# Patient Record
Sex: Male | Born: 1965 | Race: White | Hispanic: No | Smoking: Never smoker
Health system: Southern US, Community
[De-identification: ages and names within clinical notes are randomized; demographics above are authoritative.]

## PROBLEM LIST (undated history)

## (undated) DIAGNOSIS — Z9889 Other specified postprocedural states: Secondary | ICD-10-CM

## (undated) DIAGNOSIS — K219 Gastro-esophageal reflux disease without esophagitis: Secondary | ICD-10-CM

## (undated) DIAGNOSIS — J45909 Unspecified asthma, uncomplicated: Secondary | ICD-10-CM

## (undated) DIAGNOSIS — R112 Nausea with vomiting, unspecified: Secondary | ICD-10-CM

## (undated) HISTORY — PX: OTHER SURGICAL HISTORY: SHX169

## (undated) HISTORY — PX: SINUS SURGERY WITH INSTATRAK: SHX5215

---

## 2000-12-21 ENCOUNTER — Emergency Department (HOSPITAL_COMMUNITY): Admission: EM | Admit: 2000-12-21 | Discharge: 2000-12-21 | Payer: Self-pay | Admitting: Emergency Medicine

## 2002-03-07 ENCOUNTER — Encounter: Payer: Self-pay | Admitting: Internal Medicine

## 2002-03-07 ENCOUNTER — Ambulatory Visit (HOSPITAL_COMMUNITY): Admission: RE | Admit: 2002-03-07 | Discharge: 2002-03-07 | Payer: Self-pay | Admitting: Internal Medicine

## 2002-03-16 ENCOUNTER — Emergency Department (HOSPITAL_COMMUNITY): Admission: EM | Admit: 2002-03-16 | Discharge: 2002-03-17 | Payer: Self-pay | Admitting: Emergency Medicine

## 2002-03-20 ENCOUNTER — Ambulatory Visit (HOSPITAL_COMMUNITY): Admission: RE | Admit: 2002-03-20 | Discharge: 2002-03-20 | Payer: Self-pay | Admitting: Internal Medicine

## 2002-03-20 ENCOUNTER — Encounter: Payer: Self-pay | Admitting: Internal Medicine

## 2002-04-01 ENCOUNTER — Ambulatory Visit (HOSPITAL_COMMUNITY): Admission: RE | Admit: 2002-04-01 | Discharge: 2002-04-01 | Payer: Self-pay | Admitting: Pulmonary Disease

## 2002-04-08 ENCOUNTER — Ambulatory Visit (HOSPITAL_COMMUNITY): Admission: RE | Admit: 2002-04-08 | Discharge: 2002-04-08 | Payer: Self-pay | Admitting: Pulmonary Disease

## 2002-12-26 ENCOUNTER — Ambulatory Visit (HOSPITAL_COMMUNITY): Admission: RE | Admit: 2002-12-26 | Discharge: 2002-12-26 | Payer: Self-pay | Admitting: *Deleted

## 2003-12-31 ENCOUNTER — Ambulatory Visit (HOSPITAL_COMMUNITY): Admission: RE | Admit: 2003-12-31 | Discharge: 2003-12-31 | Payer: Self-pay | Admitting: Family Medicine

## 2005-12-02 ENCOUNTER — Ambulatory Visit (HOSPITAL_COMMUNITY): Admission: RE | Admit: 2005-12-02 | Discharge: 2005-12-02 | Payer: Self-pay | Admitting: Family Medicine

## 2007-10-26 ENCOUNTER — Emergency Department (HOSPITAL_COMMUNITY): Admission: EM | Admit: 2007-10-26 | Discharge: 2007-10-26 | Payer: Self-pay | Admitting: Emergency Medicine

## 2010-06-25 NOTE — Procedures (Signed)
Seth Hartman, Seth Hartman                         ACCOUNT NO.:  [id number]   MEDICAL RECORD NO.:  [id number]                   PATIENT TYPE:  OUT   LOCATION:  RAD                                  FACILITY:  [facility]   PHYSICIAN:  [place] Bing, M.D.               DATE OF BIRTH:  Jul 21, 1965   DATE OF PROCEDURE:  DATE OF DISCHARGE:                                  ECHOCARDIOGRAM   REFERRING:  Dr. Alessandra Bevels.  Dr. Dorethea Clan.   CLINICAL DATA:  A 45 year old gentleman with abnormal EKG and PVC's.   M-MODE:  1. Aorta 3.0.  2. Left atrium 3.3.  3. Septum 1.1.  4. Posterior wall 0.8.  5. LV diastole 5.4.  6. LV systole 3.6.   FINDINGS:  1. Technically-adequate echocardiographic study.  2. Normal left atrium, right atrium and right ventricle.  3. Normal mitral, aortic, tricuspid and pulmonic valves.  4. Very mild dilatation of the left ventricle; no left ventricular     hypertrophy.  5. Normal regional and global left-ventricular systolic function.      ___________________________________________                                            [place] Bing, M.D.   RR/MEDQ  D:  12/26/2002  T:  12/27/2002  Job:  [id number]

## 2010-06-25 NOTE — Procedures (Signed)
   NAMEGERAL, COKER                         ACCOUNT NO.:  [id number]   MEDICAL RECORD NO.:  [id number]                   PATIENT TYPE:  OUT   LOCATION:  RESP                                 FACILITY:  [facility]   PHYSICIAN:  Edward L. Juanetta Gosling, M.D.             DATE OF BIRTH:  [birth year]   DATE OF PROCEDURE:  04/09/2002  DATE OF DISCHARGE:                              PULMONARY FUNCTION TEST   PFT FINDINGS:  Spirometry is normal.                                               Edward L. Juanetta Gosling, M.D.    ELH/MEDQ  D:  04/09/2002  T:  04/09/2002  Job:  [id number]

## 2011-10-17 ENCOUNTER — Other Ambulatory Visit: Payer: Self-pay | Admitting: Occupational Medicine

## 2011-10-17 ENCOUNTER — Ambulatory Visit (HOSPITAL_BASED_OUTPATIENT_CLINIC_OR_DEPARTMENT_OTHER)
Admission: RE | Admit: 2011-10-17 | Discharge: 2011-10-17 | Disposition: A | Discharge status: Home / Self Care | Payer: Managed Care, Other (non HMO) | Source: Ambulatory Visit | Attending: Occupational Medicine | Admitting: Occupational Medicine

## 2011-10-17 DIAGNOSIS — Z0000 Encounter for general adult medical examination without abnormal findings: Secondary | ICD-10-CM

## 2011-11-16 ENCOUNTER — Other Ambulatory Visit (HOSPITAL_COMMUNITY): Payer: Self-pay | Admitting: Family Medicine

## 2011-11-16 ENCOUNTER — Ambulatory Visit (HOSPITAL_COMMUNITY)
Admission: RE | Admit: 2011-11-16 | Discharge: 2011-11-16 | Disposition: A | Discharge status: Home / Self Care | Payer: Managed Care, Other (non HMO) | Source: Ambulatory Visit | Attending: Family Medicine | Admitting: Family Medicine

## 2011-11-16 DIAGNOSIS — R059 Cough, unspecified: Secondary | ICD-10-CM | POA: Insufficient documentation

## 2011-11-16 DIAGNOSIS — R05 Cough: Secondary | ICD-10-CM | POA: Insufficient documentation

## 2011-11-18 ENCOUNTER — Emergency Department (HOSPITAL_COMMUNITY)
Admission: EM | Admit: 2011-11-18 | Discharge: 2011-11-19 | Disposition: A | Discharge status: Home / Self Care | Payer: Managed Care, Other (non HMO) | Attending: Emergency Medicine | Admitting: Emergency Medicine

## 2011-11-18 ENCOUNTER — Encounter (HOSPITAL_COMMUNITY): Payer: Self-pay | Admitting: *Deleted

## 2011-11-18 ENCOUNTER — Emergency Department (HOSPITAL_COMMUNITY): Payer: Managed Care, Other (non HMO)

## 2011-11-18 DIAGNOSIS — R0602 Shortness of breath: Secondary | ICD-10-CM | POA: Insufficient documentation

## 2011-11-18 DIAGNOSIS — R059 Cough, unspecified: Secondary | ICD-10-CM | POA: Insufficient documentation

## 2011-11-18 DIAGNOSIS — R05 Cough: Secondary | ICD-10-CM | POA: Insufficient documentation

## 2011-11-18 DIAGNOSIS — J45909 Unspecified asthma, uncomplicated: Secondary | ICD-10-CM

## 2011-11-18 DIAGNOSIS — R079 Chest pain, unspecified: Secondary | ICD-10-CM | POA: Insufficient documentation

## 2011-11-18 HISTORY — DX: Unspecified asthma, uncomplicated: J45.909

## 2011-11-18 LAB — CBC
HCT: 42.6 % (ref 39.0–52.0)
Hemoglobin: 14.4 g/dL (ref 13.0–17.0)
MCH: 30.3 pg (ref 26.0–34.0)
MCHC: 33.8 g/dL (ref 30.0–36.0)
MCV: 89.5 fL (ref 78.0–100.0)
Platelets: 346 10*3/uL (ref 150–400)
RBC: 4.76 MIL/uL (ref 4.22–5.81)
RDW: 12.8 % (ref 11.5–15.5)
WBC: 11.7 10*3/uL — ABNORMAL HIGH (ref 4.0–10.5)

## 2011-11-18 LAB — COMPREHENSIVE METABOLIC PANEL
ALT: 59 U/L — ABNORMAL HIGH (ref 0–53)
AST: 24 U/L (ref 0–37)
Albumin: 4 g/dL (ref 3.5–5.2)
Alkaline Phosphatase: 61 U/L (ref 39–117)
BUN: 15 mg/dL (ref 6–23)
CO2: 23 mEq/L (ref 19–32)
Calcium: 9.3 mg/dL (ref 8.4–10.5)
Chloride: 103 mEq/L (ref 96–112)
Creatinine, Ser: 0.84 mg/dL (ref 0.50–1.35)
GFR calc Af Amer: >90 mL/min (ref >90)
GFR calc non Af Amer: >90 mL/min (ref >90)
Glucose, Bld: 124 mg/dL — ABNORMAL HIGH (ref 70–99)
Potassium: 3.8 mEq/L (ref 3.5–5.1)
Sodium: 137 mEq/L (ref 135–145)
Total Bilirubin: 0.7 mg/dL (ref 0.3–1.2)
Total Protein: 7.1 g/dL (ref 6.0–8.3)

## 2011-11-18 LAB — D-DIMER, QUANTITATIVE: D-Dimer, Quant: <0.27 ug/mL-FEU (ref 0.00–0.48)

## 2011-11-18 LAB — TROPONIN I: Troponin I: <0.3 ng/mL (ref <0.30)

## 2011-11-18 MED ORDER — ALBUTEROL (5 MG/ML) CONTINUOUS INHALATION SOLN
10.0000 mg/h | INHALATION_SOLUTION | RESPIRATORY_TRACT | Status: DC
  Administered 2011-11-18: 10 mg/h via RESPIRATORY_TRACT
  Filled 2011-11-18: qty 20

## 2011-11-18 MED ORDER — IPRATROPIUM BROMIDE 0.02 % IN SOLN
0.5000 mg | Freq: Once | RESPIRATORY_TRACT | Status: AC
  Administered 2011-11-18: 0.5 mg via RESPIRATORY_TRACT
  Filled 2011-11-18: qty 2.5

## 2011-11-18 NOTE — ED Provider Notes (Cosign Needed)
History  This chart was scribed for Ward Givens, MD by Erskine Emery. This patient was seen in room APA07/APA07 and the patient's care was started at 20:11.   CSN: [id number]  Arrival date & time 11/18/11  1949   First MD Initiated Contact with Patient 11/18/11 2011      Chief Complaint  Patient presents with  . Chest Pain    (Consider location/radiation/quality/duration/timing/severity/associated sxs/prior treatment) The history is provided by the patient. No language interpreter was used.  Seth Hartman is a 46 y.o. male who presents to the Emergency Department complaining of gradually worsening intermittent SOB and chest tightness for the past 2 weeks. Pt denies any associated wheezing, fevers, leg swelling, or recent long travel but reports a mild cough. Pt reports his symptoms are usually aggravated by movement and relieved by sitting down but this afternoon the chest tightness/pressure and SOB came on while sitting down at rest. Pt reports 2 weeks ago he woke up in the middle of the night with body aches, chills, and weakness. The following day he saw a PA at [facility] who gave him a z pak and a shot. Pt is also currently on prednisone; he was taking 2 x4/day but now is taking 1 x2/day with no relief from symptoms even when he uses his inhaler. Pt has a h/o asthma and claims this episode is not like previous episodes because he has no wheezing present at this time. Pt reports he checks his oxygen with a pulse ox at work often and it is always around 98 and 99 so the fact that it is now 96 while resting is concerning. Pt had an all negative stress test about 1 year ago and had a physical at [facility] 1 month ago with all negative findings.  Dr. Sherwood Gambler is the pt's PCP.  Past Medical History  Diagnosis Date  . Asthma     Past Surgical History  Procedure Date  . Sinus surgery with instatrak   . Pvc's    Pt's father died in an MVC at 65 but his maternal grandfather died  of a heart attack in his late 70s. Pt knows of no h/o blood clots in his family.  History  Substance Use Topics  . Smoking status: Never Smoker   . Smokeless tobacco: Not on file  . Alcohol Use: No   Pt never smoked.  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 Lives with spouse  Review of Systems  Constitutional: Negative for fever and chills.  Respiratory: Positive for cough, chest tightness and shortness of breath. Negative for wheezing.   Cardiovascular: Positive for chest pain. Negative for leg swelling.  Gastrointestinal: Negative for nausea and vomiting.  Neurological: Negative for weakness.  All other systems reviewed and are negative.    Allergies  Penicillins; Bee venom; and Codeine  Home Medications   Current Outpatient Rx  Name Route Sig Dispense Refill  . ALBUTEROL SULFATE HFA 108 (90 BASE) MCG/ACT IN AERS Inhalation Inhale 2 puffs into the lungs every 6 (six) hours as needed. For shortness of breath    . PREDNISONE (PAK) PO Oral Take by mouth daily. Take as directed per package instructions (6,5,4,3,2,1)      Triage Vitals: BP 155/90  Pulse 78  Temp 98.3 F (36.8 C) (Oral)  Resp 13  Ht 5\' 6"  (1.676 m)  Wt 179 lb (81.194 kg)  BMI 28.89 kg/m2  SpO2 96%  Vital signs normal except hypertension  Physical Exam  Nursing note and vitals reviewed. Constitutional: He is oriented to person, place, and time. He appears well-developed and well-nourished.  Non-toxic appearance. He does not appear ill. No distress.  HENT:  Head: Normocephalic and atraumatic.  Right Ear: External ear normal.  Left Ear: External ear normal.  Nose: Nose normal. No mucosal edema or rhinorrhea.  Mouth/Throat: Oropharynx is clear and moist and mucous membranes are normal. No dental abscesses or uvula swelling.  Eyes: Conjunctivae normal and EOM are normal. Pupils are equal, round, and reactive to light.  Neck: Normal range of motion and full passive range of  motion without pain. Neck supple.  Cardiovascular: Normal rate, regular rhythm and normal heart sounds.  Exam reveals no gallop and no friction rub.   No murmur heard. Pulmonary/Chest: Effort normal and breath sounds normal. No respiratory distress. He has no wheezes. He has no rhonchi. He has no rales. He exhibits no tenderness and no crepitus.       Pt noted to have some coughing, especially with deep breathing.  Abdominal: Soft. Normal appearance and bowel sounds are normal. He exhibits no distension. There is no tenderness. There is no rebound and no guarding.  Musculoskeletal: Normal range of motion. He exhibits no edema and no tenderness.       Moves all extremities well.   Neurological: He is alert and oriented to person, place, and time. He has normal strength. No cranial nerve deficit.  Skin: Skin is warm, dry and intact. No rash noted. No erythema. No pallor.  Psychiatric: He has a normal mood and affect. His speech is normal and behavior is normal. His mood appears not anxious.    ED Course  Procedures (including critical care time)   Medications  PREDNISONE, PAK, PO (not administered)  albuterol (PROVENTIL HFA;VENTOLIN HFA) 108 (90 BASE) MCG/ACT inhaler (not administered)  albuterol (PROVENTIL,VENTOLIN) solution continuous neb (10 mg/hr Nebulization New Bag/Given 11/18/11 2250)  albuterol-ipratropium (COMBIVENT) 18-103 MCG/ACT inhaler (not administered)  predniSONE (DELTASONE) 10 MG tablet (not administered)  doxycycline (VIBRAMYCIN) 100 MG capsule (not administered)  ipratropium (ATROVENT) nebulizer solution 0.5 mg (0.5 mg Nebulization Given 11/18/11 2250)    DIAGNOSTIC STUDIES: Oxygen Saturation is 96% on room air, adequate by my interpretation.    COORDINATION OF CARE: 20:35--I evaluated the patient and we discussed a treatment plan including chest x-ray and blood work to which the pt agreed.   22:32--I rechecked the pt and explained his all negative imaging and lab  results. Pt consented to try a breathing treatment.  Pt is not allergic to peanuts.  23:55 recheck at end on continuous nebulizer, has improved air movement. States his chest tightness is gone.   Results for orders placed during the hospital encounter of 11/18/11  CBC      Component Value Range   WBC 11.7 (*) 4.0 - 10.5 K/uL   RBC 4.76  4.22 - 5.81 MIL/uL   Hemoglobin 14.4  13.0 - 17.0 g/dL   HCT 16.1  09.6 - 04.5 %   MCV 89.5  78.0 - 100.0 fL   MCH 30.3  26.0 - 34.0 pg   MCHC 33.8  30.0 - 36.0 g/dL   RDW 40.9  81.1 - 91.4 %   Platelets 346  150 - 400 K/uL  COMPREHENSIVE METABOLIC PANEL      Component Value Range   Sodium 137  135 - 145 mEq/L   Potassium 3.8  3.5 - 5.1 mEq/L   Chloride 103  96 - 112  mEq/L   CO2 23  19 - 32 mEq/L   Glucose, Bld 124 (*) 70 - 99 mg/dL   BUN 15  6 - 23 mg/dL   Creatinine, Ser 6.21  0.50 - 1.35 mg/dL   Calcium 9.3  8.4 - 30.8 mg/dL   Total Protein 7.1  6.0 - 8.3 g/dL   Albumin 4.0  3.5 - 5.2 g/dL   AST 24  0 - 37 U/L   ALT 59 (*) 0 - 53 U/L   Alkaline Phosphatase 61  39 - 117 U/L   Total Bilirubin 0.7  0.3 - 1.2 mg/dL   GFR calc non Af Amer >90  >90 mL/min   GFR calc Af Amer >90  >90 mL/min  TROPONIN I      Component Value Range   Troponin I <0.30  <0.30 ng/mL  D-DIMER, QUANTITATIVE      Component Value Range   D-Dimer, Quant <0.27  0.00 - 0.48 ug/mL-FEU   Laboratory interpretation all normal except leukocytosis   Dg Chest Portable 1 View  11/18/2011  *RADIOLOGY REPORT*  Clinical Data: Chest pain.  Shortness breath.  Cough.  CHEST - 1 VIEW  Comparison:  11/16/2011  Findings: The heart size and mediastinal contours are within normal limits.  Both lungs are clear.  IMPRESSION: No active disease.   Original Report Authenticated By: Danae Orleans, M.D.     Date: 11/18/2011  Rate: 80  Rhythm: normal sinus rhythm  QRS Axis: normal  Intervals: normal  ST/T Wave abnormalities: normal  Conduction Disutrbances:left anterior fascicular block   Narrative Interpretation:   Old EKG Reviewed: none available     1. Asthmatic bronchitis    New Prescriptions   ALBUTEROL-IPRATROPIUM (COMBIVENT) 18-103 MCG/ACT INHALER    Inhale 2 puffs into the lungs every 6 (six) hours as needed for wheezing.   DOXYCYCLINE (VIBRAMYCIN) 100 MG CAPSULE    Take 1 capsule (100 mg total) by mouth 2 (two) times daily.   PREDNISONE (DELTASONE) 20 MG TABLET    Take 3 po QD x 3d , then 2 po QD x 3d then 1 po QD x 3d    Plan discharge  Devoria Albe, MD, FACEP   MDM   I personally performed the services described in this documentation, which was scribed in my presence. The recorded information has been reviewed and considered.  Devoria Albe, MD, Armando Gang    Ward Givens, MD 11/19/11 [phone]

## 2011-11-18 NOTE — ED Notes (Signed)
Pt presents with chest discomfort and pressure x 5 days. Pt states was seen by PCP and treated with a z-pack and "a shot" sent home with prednisone, which he feels is not helping. Pt feels it is not cardiac, but his chest that hurts from breathing. BBS clear and equal with no respiratory distress noted,

## 2011-11-18 NOTE — ED Notes (Signed)
Family at bedside. Patient states he does not need anything at this time. 

## 2011-11-18 NOTE — ED Notes (Signed)
Has been having problem with resp .  Seen by Dr Sherwood Gambler 's office, and treated "for a cold"  Last 2 days Increased pressure in chest.

## 2011-11-19 MED ORDER — PREDNISONE 20 MG PO TABS: ORAL_TABLET | ORAL | Status: DC

## 2011-11-19 MED ORDER — IPRATROPIUM-ALBUTEROL 18-103 MCG/ACT IN AERO: 2.0000 | INHALATION_SPRAY | Freq: Four times a day (QID) | RESPIRATORY_TRACT | Status: DC | PRN

## 2011-11-19 MED ORDER — PREDNISONE 10 MG PO TABS: ORAL_TABLET | ORAL | Status: DC

## 2011-11-19 MED ORDER — DOXYCYCLINE HYCLATE 100 MG PO CAPS: 100.0000 mg | ORAL_CAPSULE | Freq: Two times a day (BID) | ORAL | Status: DC

## 2011-11-19 NOTE — ED Notes (Signed)
Pt alert & oriented x4, stable gait. Patient given discharge instructions, paperwork & prescription(s). Patient  instructed to stop at the registration desk to finish any additional paperwork. Patient verbalized understanding. Pt left department w/ no further questions. 

## 2013-01-10 ENCOUNTER — Other Ambulatory Visit (HOSPITAL_COMMUNITY): Payer: Self-pay | Admitting: Family Medicine

## 2013-01-10 ENCOUNTER — Ambulatory Visit (HOSPITAL_COMMUNITY)
Admission: RE | Admit: 2013-01-10 | Discharge: 2013-01-10 | Disposition: A | Discharge status: Home / Self Care | Payer: Managed Care, Other (non HMO) | Source: Ambulatory Visit | Attending: Family Medicine | Admitting: Family Medicine

## 2013-01-10 DIAGNOSIS — J069 Acute upper respiratory infection, unspecified: Secondary | ICD-10-CM

## 2013-01-10 DIAGNOSIS — R222 Localized swelling, mass and lump, trunk: Secondary | ICD-10-CM

## 2013-10-08 ENCOUNTER — Other Ambulatory Visit (HOSPITAL_COMMUNITY): Payer: Self-pay | Admitting: Internal Medicine

## 2013-10-08 ENCOUNTER — Ambulatory Visit (HOSPITAL_COMMUNITY)
Admission: RE | Admit: 2013-10-08 | Discharge: 2013-10-08 | Disposition: A | Discharge status: Home / Self Care | Payer: Managed Care, Other (non HMO) | Source: Ambulatory Visit | Attending: Internal Medicine | Admitting: Internal Medicine

## 2013-10-08 DIAGNOSIS — R05 Cough: Secondary | ICD-10-CM

## 2013-10-08 DIAGNOSIS — R059 Cough, unspecified: Secondary | ICD-10-CM

## 2013-10-08 DIAGNOSIS — R1310 Dysphagia, unspecified: Secondary | ICD-10-CM

## 2013-10-10 ENCOUNTER — Ambulatory Visit (HOSPITAL_COMMUNITY)
Admission: RE | Admit: 2013-10-10 | Discharge: 2013-10-10 | Disposition: A | Discharge status: Home / Self Care | Payer: Managed Care, Other (non HMO) | Source: Ambulatory Visit | Attending: Internal Medicine | Admitting: Internal Medicine

## 2013-10-10 DIAGNOSIS — R1310 Dysphagia, unspecified: Secondary | ICD-10-CM | POA: Insufficient documentation

## 2013-10-24 ENCOUNTER — Encounter (INDEPENDENT_AMBULATORY_CARE_PROVIDER_SITE_OTHER): Payer: Self-pay | Admitting: *Deleted

## 2013-11-14 ENCOUNTER — Ambulatory Visit (INDEPENDENT_AMBULATORY_CARE_PROVIDER_SITE_OTHER): Payer: Managed Care, Other (non HMO) | Admitting: Internal Medicine

## 2013-11-14 ENCOUNTER — Encounter (INDEPENDENT_AMBULATORY_CARE_PROVIDER_SITE_OTHER): Payer: Self-pay | Admitting: *Deleted

## 2013-11-14 ENCOUNTER — Other Ambulatory Visit (INDEPENDENT_AMBULATORY_CARE_PROVIDER_SITE_OTHER): Payer: Self-pay | Admitting: *Deleted

## 2013-11-14 ENCOUNTER — Encounter (INDEPENDENT_AMBULATORY_CARE_PROVIDER_SITE_OTHER): Payer: Self-pay | Admitting: Internal Medicine

## 2013-11-14 VITALS — BP 114/76 | HR 76 | Temp 97.9°F | Ht 66.0 in | Wt 182.8 lb

## 2013-11-14 DIAGNOSIS — R933 Abnormal findings on diagnostic imaging of other parts of digestive tract: Secondary | ICD-10-CM

## 2013-11-14 DIAGNOSIS — R1314 Dysphagia, pharyngoesophageal phase: Secondary | ICD-10-CM

## 2013-11-14 DIAGNOSIS — K228 Other specified diseases of esophagus: Secondary | ICD-10-CM

## 2013-11-14 DIAGNOSIS — K2289 Other specified disease of esophagus: Secondary | ICD-10-CM

## 2013-11-14 DIAGNOSIS — R1310 Dysphagia, unspecified: Secondary | ICD-10-CM

## 2013-11-14 DIAGNOSIS — K229 Disease of esophagus, unspecified: Secondary | ICD-10-CM

## 2013-11-14 DIAGNOSIS — J45909 Unspecified asthma, uncomplicated: Secondary | ICD-10-CM

## 2013-11-14 NOTE — Progress Notes (Signed)
   Subjective:    Patient ID: Seth Hartman, male    DOB: 1966/02/08, 48 y.o.   MRN: [id number]  HPI Referred to our office by Dr. [name] for dysphagia/abnormal Esophagram.  He tells me he thought he had breathing issues. Saw Dr. [name]. Underwent an DG Esophagram. He says he has solid food dysphagia. Foods are very slow to go down. He was referred to Dr. [name] and underwent a laryngoscopy. Patient states Dr. [name] could not see the ? Small polypoid lesion. After starting the Omeprazole, he says his acid reflux and swallowing is better. He did have an episode Sunday of dysphagia after eating beef and rice. He had to vomit the beef and rice back up after lodging. Hoarseness is better. Appetite is good. No unintentional weight loss. No abdominal pain. Usually has a BM x 1 daily. No melea or BRRB.     10/10/2013 DG Esophagram: Dysphagia. IMPRESSION:  No esophageal mass or stricture identified.  Questionable tiny filling defect versus artifact at the RIGHT  lateral aspect of the junction of the inferior hypopharynx with the  cervical esophagus; small polypoid lesion not excluded, recommend  direct visualization to exclude tumor.  Review of Systems Past Medical History  Diagnosis Date  . Asthma   . Asthma     Past Surgical History  Procedure Laterality Date  . Sinus surgery with instatrak    . Pvc's      Allergies  Allergen Reactions  . Penicillins Shortness Of Breath    REACTION: Higher dose resulted in breathing difficulty  . Bee Venom Swelling  . Codeine Nausea And Vomiting, Nausea Only and Other (See Comments)    REACTION: dizziness    Current Outpatient Prescriptions on File Prior to Visit  Medication Sig Dispense Refill  . albuterol (PROVENTIL HFA;VENTOLIN HFA) 108 (90 BASE) MCG/ACT inhaler Inhale 2 puffs into the lungs every 6 (six) hours as needed. For shortness of breath       No current facility-administered medications on file prior to visit.        Objective:    Physical Exam  Filed Vitals:   11/14/13 0950  BP: 114/76  Pulse: 76  Temp: 97.9 F (36.6 C)  Height: 5\' 6"  (1.676 m)  Weight: 182 lb 12.8 oz (82.918 kg)   Alert and oriented. Skin warm and dry. Oral mucosa is moist.   . Sclera anicteric, conjunctivae is pink. Thyroid not enlarged. No cervical lymphadenopathy. Lungs clear. Heart regular rate and rhythm.  Abdomen is soft. Bowel sounds are positive. No hepatomegaly. No abdominal masses felt. No tenderness.  No edema to lower extremities.          Assessment & Plan:  Solid foods dysphagia, GERD. Abnormal DG Esophagram.  EGD/ED

## 2013-11-14 NOTE — Patient Instructions (Signed)
EGD/ED. The risks and benefits such as perforation, bleeding, and infection were reviewed with the patient and is agreeable. 

## 2013-11-15 ENCOUNTER — Encounter (HOSPITAL_COMMUNITY): Payer: Self-pay | Admitting: Pharmacy Technician

## 2013-11-15 DIAGNOSIS — R1314 Dysphagia, pharyngoesophageal phase: Secondary | ICD-10-CM | POA: Insufficient documentation

## 2013-11-15 DIAGNOSIS — J45909 Unspecified asthma, uncomplicated: Secondary | ICD-10-CM | POA: Insufficient documentation

## 2013-11-15 DIAGNOSIS — K228 Other specified diseases of esophagus: Secondary | ICD-10-CM | POA: Insufficient documentation

## 2013-11-15 DIAGNOSIS — R1319 Other dysphagia: Secondary | ICD-10-CM | POA: Insufficient documentation

## 2013-11-15 DIAGNOSIS — K2289 Other specified disease of esophagus: Secondary | ICD-10-CM | POA: Insufficient documentation

## 2013-11-22 ENCOUNTER — Ambulatory Visit (HOSPITAL_COMMUNITY)
Admission: RE | Admit: 2013-11-22 | Discharge: 2013-11-22 | Disposition: A | Discharge status: Home / Self Care | Payer: Managed Care, Other (non HMO) | Source: Ambulatory Visit | Attending: Internal Medicine | Admitting: Internal Medicine

## 2013-11-22 ENCOUNTER — Encounter (HOSPITAL_COMMUNITY)
Admission: RE | Disposition: A | Discharge status: Home / Self Care | Payer: Self-pay | Source: Ambulatory Visit | Attending: Internal Medicine

## 2013-11-22 ENCOUNTER — Encounter (HOSPITAL_COMMUNITY): Payer: Self-pay | Admitting: *Deleted

## 2013-11-22 DIAGNOSIS — J45909 Unspecified asthma, uncomplicated: Secondary | ICD-10-CM | POA: Insufficient documentation

## 2013-11-22 DIAGNOSIS — R1310 Dysphagia, unspecified: Secondary | ICD-10-CM | POA: Insufficient documentation

## 2013-11-22 DIAGNOSIS — K219 Gastro-esophageal reflux disease without esophagitis: Secondary | ICD-10-CM | POA: Insufficient documentation

## 2013-11-22 DIAGNOSIS — Z885 Allergy status to narcotic agent status: Secondary | ICD-10-CM | POA: Insufficient documentation

## 2013-11-22 DIAGNOSIS — Z880 Allergy status to penicillin: Secondary | ICD-10-CM | POA: Diagnosis not present

## 2013-11-22 DIAGNOSIS — Z91030 Bee allergy status: Secondary | ICD-10-CM | POA: Diagnosis not present

## 2013-11-22 DIAGNOSIS — R933 Abnormal findings on diagnostic imaging of other parts of digestive tract: Secondary | ICD-10-CM

## 2013-11-22 DIAGNOSIS — K229 Disease of esophagus, unspecified: Secondary | ICD-10-CM

## 2013-11-22 HISTORY — PX: ESOPHAGOGASTRODUODENOSCOPY: SHX5428

## 2013-11-22 HISTORY — PX: MALONEY DILATION: SHX5535

## 2013-11-22 HISTORY — DX: Gastro-esophageal reflux disease without esophagitis: K21.9

## 2013-11-22 SURGERY — EGD (ESOPHAGOGASTRODUODENOSCOPY)
Anesthesia: Moderate Sedation

## 2013-11-22 MED ORDER — MIDAZOLAM HCL 5 MG/5ML IJ SOLN
INTRAMUSCULAR | Status: DC
Start: 2013-11-22 — End: 2013-11-22
  Filled 2013-11-22: qty 10

## 2013-11-22 MED ORDER — MEPERIDINE HCL 50 MG/ML IJ SOLN
INTRAMUSCULAR | Status: AC
  Filled 2013-11-22: qty 1

## 2013-11-22 MED ORDER — BUTAMBEN-TETRACAINE-BENZOCAINE 2-2-14 % EX AERO
INHALATION_SPRAY | CUTANEOUS | Status: DC | PRN
  Administered 2013-11-22: 2 via TOPICAL

## 2013-11-22 MED ORDER — SODIUM CHLORIDE 0.9 % IV SOLN
INTRAVENOUS | Status: DC
  Administered 2013-11-22: 11:00:00 via INTRAVENOUS

## 2013-11-22 MED ORDER — STERILE WATER FOR IRRIGATION IR SOLN
Status: DC | PRN
  Administered 2013-11-22: 12:00:00

## 2013-11-22 MED ORDER — MIDAZOLAM HCL 5 MG/5ML IJ SOLN
INTRAMUSCULAR | Status: DC | PRN
  Administered 2013-11-22 (×3): 2 mg via INTRAVENOUS

## 2013-11-22 MED ORDER — MEPERIDINE HCL 50 MG/ML IJ SOLN
INTRAMUSCULAR | Status: DC | PRN
  Administered 2013-11-22 (×2): 25 mg via INTRAVENOUS

## 2013-11-22 NOTE — Discharge Instructions (Signed)
Presumed usual medications and diet. No driving for 24 hours. Physician will call with biopsy results.  Gastrointestinal Endoscopy, Care After Refer to this sheet in the next few weeks. These instructions provide you with information on caring for yourself after your procedure. Your caregiver may also give you more specific instructions. Your treatment has been planned according to current medical practices, but problems sometimes occur. Call your caregiver if you have any problems or questions after your procedure. HOME CARE INSTRUCTIONS  If you were given medicine to help you relax (sedative), do not drive, operate machinery, or sign important documents for 24 hours.  Avoid alcohol and hot or warm beverages for the first 24 hours after the procedure.  Only take over-the-counter or prescription medicines for pain, discomfort, or fever as directed by your caregiver. You may resume taking your normal medicines unless your caregiver tells you otherwise. Ask your caregiver when you may resume taking medicines that may cause bleeding, such as aspirin, clopidogrel, or warfarin.  You may return to your normal diet and activities on the day after your procedure, or as directed by your caregiver. Walking may help to reduce any bloated feeling in your abdomen.  Drink enough fluids to keep your urine clear or pale yellow.  You may gargle with salt water if you have a sore throat. SEEK IMMEDIATE MEDICAL CARE IF:  You have severe nausea or vomiting.  You have severe abdominal pain, abdominal cramps that last longer than 6 hours, or abdominal swelling (distention).  You have severe shoulder or back pain.  You have trouble swallowing.  You have shortness of breath, your breathing is shallow, or you are breathing faster than normal.  You have a fever or a rapid heartbeat.  You vomit blood or material that looks like coffee grounds.  You have bloody, black, or tarry stools. MAKE SURE  YOU:  Understand these instructions.  Will watch your condition.  Will get help right away if you are not doing well or get worse. Document Released: 09/08/2003 Document Revised: 06/10/2013 Document Reviewed: 04/26/2011 [facility] Patient Information 2015 [place], [place]. This information is not intended to replace advice given to you by your health care provider. Make sure you discuss any questions you have with your health care provider.

## 2013-11-22 NOTE — Op Note (Signed)
EGD PROCEDURE REPORT  PATIENT:  Seth Hartman  MR#:  [id number] Birthdate:  1966/01/22, 48 y.o., male Endoscopist:  Dr. Rogene Houston, MD Referred By:  Dr. Sherrilee Gilles. [name], MD  Procedure Date: 11/22/2013  Procedure:   EGD with ED.  Indications:  Patient is a 48 year old Caucasian male who presents with a one-year history of dysphagia primarily to solids. BPE reveals questionable abnormality at junction of hypopharynx and esophagus an evaluation by Dr. [name] was negative. He is undergoing diagnostic and therapeutic EGD. He is having less difficulty swallowing since he was placed on omeprazole by Dr. [name] but he does not have heartburn.  Informed Consent:  The risks, benefits, alternatives & imponderables which include, but are not limited to, bleeding, infection, perforation, drug reaction and potential missed lesion have been reviewed.  The potential for biopsy, lesion removal, esophageal dilation, etc. have also been discussed.  Questions have been answered.  All parties agreeable.  Please see history & physical in medical record for more information.  Medications:  Demerol 50 mg IV Versed 6 mg IV Cetacaine spray topically for oropharyngeal anesthesia  Description of procedure:  The endoscope was introduced through the mouth and advanced to the second portion of the duodenum without difficulty or limitations. The mucosal surfaces were surveyed very carefully during advancement of the scope and upon withdrawal.  Findings:  Esophagus:  Mucosa of the proximal segment was normal. Mucosa body and distal segment reveals a fine circumferential rings note critical narrowing. There was a fine nodularity to mucosa at distal esophagus. No erosions or ulcers noted. Similarly no ring or stricture noted at GE junction. GEJ:  40 cm Stomach:  Stomach was empty and distended well with insufflation. Fold in the proximal stomach were normal. Examination of mucosa at body, antrum, pyloric channel,  angularis, fundus and cardia was normal. Duodenum:  Normal bulbar and post bulbar mucosa.  Therapeutic/Diagnostic Maneuvers Performed:   Esophagus was dilated by passing a 54 [place] Maloney dilator to full insertion. Endoscope was passed post dilation and no mucosal disruption noted. Random biopsies are taken from mucosa of mid and distal esophagus for routine histology.  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 These changes are subtle. No ring or stricture identified. Esophagus dilated by passing 69 [place] Maloney dilator. The biopsies taken from mucosa of esophagus looking for EoE   Recommendations:  Continue omeprazole 20 mg by mouth every morning. I will be contacting patient with biopsy results and further recommendations.  REHMAN,NAJEEB U  11/22/2013  12:24 PM  CC: Dr. Glo Herring., MD & Dr. Rayne Du ref. provider found

## 2013-11-22 NOTE — H&P (Signed)
Seth Hartman is an 48 y.o. male.   Chief Complaint: Patient is here for EGD and ED. HPI: Patient is a 48 year old Caucasian male who is having intermittent dysphagia to solids for about a year. These 2 episodes of food impaction or regurgitation. He points to lower sternal area site of bolus obstruction. Usually food would hang up for 2-3 minutes and would go down. He's had most difficulty with meat and steak. He had barium study by Dr. [name] which suggested a filling defect in the hypopharynx. He's been evaluated by Dr. [name] and no such lesion was found. He was begun on omeprazole. Patient says that he is having less swelling difficulty since he has been on omeprazole. He denies weight loss. He states that she has gone on. He denies heartburn abdominal pain melena or rectal bleeding.  Past Medical History  Diagnosis Date  . Asthma   . Asthma   . GERD (gastroesophageal reflux disease)     Past Surgical History  Procedure Laterality Date  . Sinus surgery with instatrak    . Pvc's      History reviewed. No pertinent family history. Social History:  reports that he has never smoked. He does not have any smokeless tobacco history on file. He reports that he does not drink alcohol or use illicit drugs.  Allergies:  Allergies  Allergen Reactions  . Penicillins Shortness Of Breath    REACTION: Higher dose resulted in breathing difficulty  . Bee Venom Swelling  . Codeine Nausea And Vomiting, Nausea Only and Other (See Comments)    REACTION: dizziness    Medications Prior to Admission  Medication Sig Dispense Refill  . aspirin 81 MG tablet Take 81 mg by mouth daily.      [name] omeprazole (PRILOSEC) 20 MG capsule Take 20 mg by mouth daily.      [name] albuterol (PROVENTIL HFA;VENTOLIN HFA) 108 (90 BASE) MCG/ACT inhaler Inhale 2 puffs into the lungs every 6 (six) hours as needed. For shortness of breath        No results found for this or any previous visit (from the past 48 hour(s)). No results  found.  ROS  Blood pressure 140/100, pulse 74, temperature 98.1 F (36.7 C), temperature source Oral, resp. rate 16, height 5\' 6"  (1.676 m), weight 182 lb (82.555 kg), SpO2 99.00%. Physical Exam  Constitutional: He appears well-developed and well-nourished.  HENT:  Mouth/Throat: Oropharynx is clear and moist.  Eyes: Conjunctivae are normal. No scleral icterus.  Neck: No thyromegaly present.  Cardiovascular: Normal rate, regular rhythm and normal heart sounds.   No murmur heard. Respiratory: Effort normal.  GI: Soft. He exhibits no distension and no mass. There is no tenderness.  Musculoskeletal: He exhibits no edema.  Lymphadenopathy:    He has no cervical adenopathy.  Neurological: He is alert.  Skin: Skin is warm and dry.     Assessment/Plan Solid food dysphagia. EGD an ED.  Grainger Mccarley U 11/22/2013, 11:50 AM

## 2013-11-27 ENCOUNTER — Encounter (HOSPITAL_COMMUNITY): Payer: Self-pay | Admitting: Internal Medicine

## 2013-12-05 ENCOUNTER — Encounter (INDEPENDENT_AMBULATORY_CARE_PROVIDER_SITE_OTHER): Payer: Self-pay | Admitting: *Deleted

## 2013-12-06 ENCOUNTER — Encounter (INDEPENDENT_AMBULATORY_CARE_PROVIDER_SITE_OTHER): Payer: Self-pay | Admitting: *Deleted

## 2013-12-22 ENCOUNTER — Emergency Department (HOSPITAL_COMMUNITY): Payer: Managed Care, Other (non HMO)

## 2013-12-22 ENCOUNTER — Emergency Department (HOSPITAL_COMMUNITY)
Admission: EM | Admit: 2013-12-22 | Discharge: 2013-12-22 | Disposition: A | Discharge status: Home / Self Care | Payer: Managed Care, Other (non HMO) | Attending: Emergency Medicine | Admitting: Emergency Medicine

## 2013-12-22 ENCOUNTER — Encounter (HOSPITAL_COMMUNITY): Payer: Self-pay | Admitting: Cardiology

## 2013-12-22 DIAGNOSIS — Z880 Allergy status to penicillin: Secondary | ICD-10-CM | POA: Diagnosis not present

## 2013-12-22 DIAGNOSIS — J45909 Unspecified asthma, uncomplicated: Secondary | ICD-10-CM | POA: Insufficient documentation

## 2013-12-22 DIAGNOSIS — N200 Calculus of kidney: Secondary | ICD-10-CM | POA: Insufficient documentation

## 2013-12-22 DIAGNOSIS — Z79899 Other long term (current) drug therapy: Secondary | ICD-10-CM | POA: Diagnosis not present

## 2013-12-22 DIAGNOSIS — R109 Unspecified abdominal pain: Secondary | ICD-10-CM | POA: Diagnosis present

## 2013-12-22 DIAGNOSIS — K219 Gastro-esophageal reflux disease without esophagitis: Secondary | ICD-10-CM | POA: Diagnosis not present

## 2013-12-22 DIAGNOSIS — Z7982 Long term (current) use of aspirin: Secondary | ICD-10-CM | POA: Insufficient documentation

## 2013-12-22 LAB — CBC WITH DIFFERENTIAL/PLATELET
BASOS ABS: 0 10*3/uL (ref 0.0–0.1)
Basophils Relative: 0 % (ref 0–1)
EOS PCT: 1 % (ref 0–5)
Eosinophils Absolute: 0.1 10*3/uL (ref 0.0–0.7)
HEMATOCRIT: 43.2 % (ref 39.0–52.0)
Hemoglobin: 15.1 g/dL (ref 13.0–17.0)
LYMPHS ABS: 1.5 10*3/uL (ref 0.7–4.0)
LYMPHS PCT: 13 % (ref 12–46)
MCH: 30.8 pg (ref 26.0–34.0)
MCHC: 35 g/dL (ref 30.0–36.0)
MCV: 88.2 fL (ref 78.0–100.0)
MONO ABS: 0.8 10*3/uL (ref 0.1–1.0)
Monocytes Relative: 7 % (ref 3–12)
Neutro Abs: 9.4 10*3/uL — ABNORMAL HIGH (ref 1.7–7.7)
Neutrophils Relative %: 79 % — ABNORMAL HIGH (ref 43–77)
PLATELETS: 241 10*3/uL (ref 150–400)
RBC: 4.9 MIL/uL (ref 4.22–5.81)
RDW: 13 % (ref 11.5–15.5)
WBC: 11.8 10*3/uL — ABNORMAL HIGH (ref 4.0–10.5)

## 2013-12-22 LAB — URINE MICROSCOPIC-ADD ON

## 2013-12-22 LAB — BASIC METABOLIC PANEL
Anion gap: 16 — ABNORMAL HIGH (ref 5–15)
BUN: 14 mg/dL (ref 6–23)
CO2: 23 meq/L (ref 19–32)
CREATININE: 1.13 mg/dL (ref 0.50–1.35)
Calcium: 9.8 mg/dL (ref 8.4–10.5)
Chloride: 103 mEq/L (ref 96–112)
GFR calc Af Amer: 89 mL/min — ABNORMAL LOW (ref >90)
GFR calc non Af Amer: 77 mL/min — ABNORMAL LOW (ref >90)
GLUCOSE: 140 mg/dL — AB (ref 70–99)
Potassium: 3.7 mEq/L (ref 3.7–5.3)
SODIUM: 142 meq/L (ref 137–147)

## 2013-12-22 LAB — URINALYSIS, ROUTINE W REFLEX MICROSCOPIC
Glucose, UA: NEGATIVE mg/dL
KETONES UR: NEGATIVE mg/dL
Leukocytes, UA: NEGATIVE
Nitrite: NEGATIVE
PROTEIN: 100 mg/dL — AB
Specific Gravity, Urine: >1.03 — ABNORMAL HIGH (ref 1.005–1.030)
UROBILINOGEN UA: 0.2 mg/dL (ref 0.0–1.0)
pH: 5 (ref 5.0–8.0)

## 2013-12-22 MED ORDER — FENTANYL CITRATE 0.05 MG/ML IJ SOLN
50.0000 ug | Freq: Once | INTRAMUSCULAR | Status: AC
  Administered 2013-12-22: 50 ug via INTRAVENOUS
  Filled 2013-12-22: qty 2

## 2013-12-22 MED ORDER — PROMETHAZINE HCL 25 MG PO TABS: 25.0000 mg | ORAL_TABLET | Freq: Four times a day (QID) | ORAL | Status: DC | PRN

## 2013-12-22 MED ORDER — ONDANSETRON HCL 4 MG/2ML IJ SOLN
4.0000 mg | Freq: Once | INTRAMUSCULAR | Status: AC
  Administered 2013-12-22: 4 mg via INTRAVENOUS
  Filled 2013-12-22: qty 2

## 2013-12-22 MED ORDER — TAMSULOSIN HCL 0.4 MG PO CAPS: 0.4000 mg | ORAL_CAPSULE | Freq: Every day | ORAL | Status: DC

## 2013-12-22 MED ORDER — OXYCODONE-ACETAMINOPHEN 5-325 MG PO TABS: 2.0000 | ORAL_TABLET | ORAL | Status: DC | PRN

## 2013-12-22 NOTE — Discharge Instructions (Signed)
You have a 3 mm kidney stone in your right ureter which is the tube that connects your kidney to the bladder.   Medication for pain, nausea, and to increase her urinary flow. Follow-up with urologist if not improving. Phone number given.

## 2013-12-22 NOTE — ED Provider Notes (Signed)
CSN: [id number]     Arrival date & time 12/22/13  1336 History  This chart was scribed for Nat Christen, MD by Tula Nakayama, ED Scribe. This patient was seen in room APA02/APA02 and the patient's care was started at 3:23 PM.    Chief Complaint  Patient presents with  . Flank Pain   The history is provided by the patient. No language interpreter was used.    HPI Comments: Seth Hartman is a 48 y.o. male who presents to the Emergency Department complaining of sharp, severe right flank pain that radiates to back and RLQ and started 4 hours ago. Pt notes testicular burning that started 20 minutes ago, decreased urinary output and 1 episode of vomitting as associated symptoms. Pt received pain medication of Sublimaze and Zofran in the ED with relief. He denies history of kidney stones.  Past Medical History  Diagnosis Date  . Asthma   . Asthma   . GERD (gastroesophageal reflux disease)    Past Surgical History  Procedure Laterality Date  . Sinus surgery with instatrak    . Pvc's    . Esophagogastroduodenoscopy N/A 11/22/2013    Procedure: ESOPHAGOGASTRODUODENOSCOPY (EGD);  Surgeon: Rogene Houston, MD;  [facility]: [facility];  Service: Endoscopy;  Laterality: N/A;  1155  . Maloney dilation N/A 11/22/2013    Procedure: Venia Minks DILATION;  Surgeon: Rogene Houston, MD;  Location: [facility];  Service: Endoscopy;  Laterality: N/A;   History reviewed. No pertinent family history. History  Substance Use Topics  . Smoking status: Never Smoker   . Smokeless tobacco: Not on file  . Alcohol Use: No    Review of Systems  10 Systems reviewed and all are negative for acute change except as noted in the HPI.  Allergies  Penicillins; Bee venom; and Codeine  Home Medications   Prior to Admission medications   Medication Sig Start Date End Date Taking? Authorizing Provider  albuterol (PROVENTIL HFA;VENTOLIN HFA) 108 (90 BASE) MCG/ACT inhaler Inhale 2 puffs into the lungs every 6 (six)  hours as needed. For shortness of breath   Yes Historical Provider, MD  aspirin EC 81 MG tablet Take 81 mg by mouth daily.   Yes Historical Provider, MD  omeprazole (PRILOSEC) 20 MG capsule Take 20 mg by mouth daily.   Yes Historical Provider, MD  aspirin 81 MG tablet Take 81 mg by mouth daily.    Historical Provider, MD  oxyCODONE-acetaminophen (PERCOCET) 5-325 MG per tablet Take 2 tablets by mouth every 4 (four) hours as needed. 12/22/13   Nat Christen, MD  promethazine (PHENERGAN) 25 MG tablet Take 1 tablet (25 mg total) by mouth every 6 (six) hours as needed. 12/22/13   Nat Christen, MD  tamsulosin (FLOMAX) 0.4 MG CAPS capsule Take 1 capsule (0.4 mg total) by mouth daily. 12/22/13   Nat Christen, MD   BP 133/81 mmHg  Pulse 85  Temp(Src) 98 F (36.7 C) (Oral)  Resp 18  Ht 5\' 6"  (1.676 m)  Wt 180 lb (81.647 kg)  BMI 29.07 kg/m2  SpO2 100% Physical Exam  Constitutional: He is oriented to person, place, and time. He appears well-developed and well-nourished.  HENT:  Head: Normocephalic and atraumatic.  Eyes: Conjunctivae and EOM are normal. Pupils are equal, round, and reactive to light.  Neck: Normal range of motion. Neck supple.  Cardiovascular: Normal rate, regular rhythm and normal heart sounds.   Pulmonary/Chest: Effort normal and breath sounds normal.  Abdominal: Soft. Bowel sounds are  normal. There is no tenderness.  Genitourinary:  No right flank tenderness  Musculoskeletal: Normal range of motion.  Neurological: He is alert and oriented to person, place, and time.  Skin: Skin is warm and dry.  Psychiatric: He has a normal mood and affect. His behavior is normal.  Nursing note and vitals reviewed.   ED Course  Procedures (including critical care time) DIAGNOSTIC STUDIES: Oxygen Saturation is 100% on RA, normal by my interpretation.    COORDINATION OF CARE: 3:26 PM Discussed treatment plan with pt which includes UA, CT Abdomen and pain medication. Pt agreed to plan.  Labs  Review Labs Reviewed  URINALYSIS, ROUTINE W REFLEX MICROSCOPIC - Abnormal; Notable for the following:    Color, Urine AMBER (*)    APPearance CLOUDY (*)    Specific Gravity, Urine >1.030 (*)    Hgb urine dipstick LARGE (*)    Bilirubin Urine SMALL (*)    Protein, ur 100 (*)    All other components within normal limits  CBC WITH DIFFERENTIAL - Abnormal; Notable for the following:    WBC 11.8 (*)    Neutrophils Relative % 79 (*)    Neutro Abs 9.4 (*)    All other components within normal limits  BASIC METABOLIC PANEL - Abnormal; Notable for the following:    Glucose, Bld 140 (*)    GFR calc non Af Amer 77 (*)    GFR calc Af Amer 89 (*)    Anion gap 16 (*)    All other components within normal limits  URINE MICROSCOPIC-ADD ON - Abnormal; Notable for the following:    Crystals CA OXALATE CRYSTALS (*)    All other components within normal limits    Imaging Review Ct Renal Stone Study  12/22/2013   CLINICAL DATA:  Severe right flank pain radiating to the back in the right lower quadrant beginning earlier today. Testicular burning and decreased urinary output of with 1 episode vomiting.  EXAM: CT ABDOMEN AND PELVIS WITHOUT CONTRAST  TECHNIQUE: Multidetector CT imaging of the abdomen and pelvis was performed following the standard protocol without IV contrast.  COMPARISON:  None.  FINDINGS: Visualized lung bases are clear.  No pleural effusion.  Diffusely low attenuation of the liver is consistent with moderate steatosis. The gallbladder, spleen, adrenal glands, and pancreas have an unremarkable unenhanced appearance. Two punctate right renal calculi are noted measuring 2 mm in the interpolar kidney and 3 mm in the lower pole. There are approximately 4 punctate nonobstructing calculi in the left kidney, the largest measuring 3 mm in the lower pole. There is a 2 mm calculus in the distal right ureter, and there is mild right hydroureteronephrosis.  There is no evidence of bowel obstruction.  Appendix is identified in the right lower quadrant and is unremarkable. Bladder is unremarkable. Prostate is within normal limits. No free fluid or enlarged lymph nodes are identified. Mild lumbar spondylosis is noted, with mild to moderate disc space narrowing and vacuum disc phenomenon at L4-5.  IMPRESSION: 1. 3 mm stone in the distal right ureter with mild hydroureteronephrosis. 2. Bilateral punctate nonobstructing renal calculi. 3. Hepatic steatosis.   Electronically Signed   By: Logan Bores   On: 12/22/2013 17:03     EKG Interpretation None      MDM   Final diagnoses:  Right flank pain  Right kidney stone    History suggestive of right kidney stone. CT scan confirms a 3 mm stone in the distal right ureter with mild  hydronephrosis. Patient's pain is well-controlled. Discussed findings with patient. Discharge medications Percocet, Phenergan 25 mg, Flomax 0.4 mg. Referral to urology.  I personally performed the services described in this documentation, which was scribed in my presence. The recorded information has been reviewed and is accurate.     Nat Christen, MD 12/22/13 [phone]

## 2013-12-22 NOTE — ED Notes (Signed)
Right flank pain times 2 hours.  Vomited times 1.

## 2014-05-14 ENCOUNTER — Ambulatory Visit (INDEPENDENT_AMBULATORY_CARE_PROVIDER_SITE_OTHER): Payer: Managed Care, Other (non HMO) | Admitting: Internal Medicine

## 2014-05-14 ENCOUNTER — Encounter (INDEPENDENT_AMBULATORY_CARE_PROVIDER_SITE_OTHER): Payer: Self-pay | Admitting: Internal Medicine

## 2014-05-14 VITALS — BP 110/70 | HR 64 | Temp 98.0°F | Ht 66.0 in | Wt 185.0 lb

## 2014-05-14 DIAGNOSIS — K219 Gastro-esophageal reflux disease without esophagitis: Secondary | ICD-10-CM

## 2014-05-14 MED ORDER — PANTOPRAZOLE SODIUM 40 MG PO TBEC: 40.0000 mg | DELAYED_RELEASE_TABLET | Freq: Two times a day (BID) | ORAL | Status: DC

## 2014-05-14 NOTE — Progress Notes (Addendum)
   Subjective:    Patient ID: Seth Hartman, male    DOB: 1965-12-31, 49 y.o.   MRN: [id number]  HPI Presents today with c/o that last week he ate [place] foods and [employer] cups. He woke the next morning felt bad. No fever.  He also had a cough. He said he was nauseated. He developed diarrhea.  He was having at least one stool and hr Wednesday night. Diarrhea did slow up and frequency decreased. He took Imoidum which did help. He thinks it was GERD related. Symptoms  resolved Sunday.  He tells me he feels good now. 100% better. His acid reflux is okay.  He takes the Omeprazole BID. Really does not completely control his symptoms.  Appetite is good. No weight loss.  No dysphagia. BMs are normal now.    11/22/2013 EGD/ED: Indications: Patient is a 49 year old Caucasian male who presents with a one-year history of dysphagia primarily to solids. BPE reveals questionable abnormality at junction of hypopharynx and esophagus an evaluation by Dr. [name] was negative. 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 These changes are subtle. No ring or stricture identified. Esophagus dilated by passing 65 [place] Maloney dilator. The biopsies taken from mucosa of esophagus looking for EoE  Biopsy negative for EoE; it does show some changes of reflux esophagitis. Patient states his swallowing has improved great deal. Patient will keep symptom diary until office visit in 6 months. Please send biopsy report to Dr. [name]    10/10/2013 DG Esophagram: Dysphagia. IMPRESSION:  No esophageal mass or stricture identified.  Questionable tiny filling defect versus artifact at the RIGHT  lateral aspect of the junction of the inferior hypopharynx with the  cervical esophagus; small polypoid lesion not excluded, recommend  direct visualization to exclude tumor. Review of Systems     Objective:   Physical Exam Blood pressure 110/70, pulse 64, temperature 98 F (36.7 C),  height 5\' 6"  (1.676 m), weight 185 lb (83.915 kg).\  Alert and oriented. Skin warm and dry. Oral mucosa is moist.   . Sclera anicteric, conjunctivae is pink. Thyroid not enlarged. No cervical lymphadenopathy. Lungs clear. Heart regular rate and rhythm.  Abdomen is soft. Bowel sounds are positive. No hepatomegaly. No abdominal masses felt. No tenderness.  No edema to lower extremities.        Assessment & Plan:  Gastroenteritis ? Etiology, possible from eating [place], resolved ow.   GERD not completely controlled with Omeprazole.  Am going to start Protonix 40mg  BID  GERD diet

## 2014-05-14 NOTE — Patient Instructions (Addendum)
Rx for Protonix BID. OV in May

## 2014-06-09 ENCOUNTER — Ambulatory Visit (INDEPENDENT_AMBULATORY_CARE_PROVIDER_SITE_OTHER): Payer: Self-pay | Admitting: Internal Medicine

## 2014-08-12 ENCOUNTER — Ambulatory Visit (INDEPENDENT_AMBULATORY_CARE_PROVIDER_SITE_OTHER): Payer: Managed Care, Other (non HMO) | Admitting: Internal Medicine

## 2014-08-12 ENCOUNTER — Encounter (INDEPENDENT_AMBULATORY_CARE_PROVIDER_SITE_OTHER): Payer: Self-pay | Admitting: Internal Medicine

## 2014-08-12 VITALS — BP 122/68 | HR 64 | Temp 98.1°F | Ht 66.0 in | Wt 188.8 lb

## 2014-08-12 DIAGNOSIS — K219 Gastro-esophageal reflux disease without esophagitis: Secondary | ICD-10-CM

## 2014-08-12 NOTE — Patient Instructions (Signed)
OV in 1 year. Continue the Protonix twice a day

## 2014-08-12 NOTE — Progress Notes (Signed)
Subjective:    Patient ID: Seth Hartman, male    DOB: 1965-07-29, 49 y.o.   MRN: [id number]  HPI Here today for f/u. Last seen  In April with possible gastroenteritis. He tells me he had an episode with having loose stools x 3 days.  He had 5-6 stools a day over the 3 day period. It did clear with Imodium.  He had not been on any antibiotics. He is having two stools a day now. Stools are formed. He has no dysphagia. Acid reflux for the most part controlled with Protonix.  His appetite os good. No weight loss. He has gained 4 pounds.  No SOB.     11/22/2013 EGD/ED: Indications: Patient is a 49 year old Caucasian male who presents with a one-year history of dysphagia primarily to solids. BPE reveals questionable abnormality at junction of hypopharynx and esophagus an evaluation by Dr. [name] was negative. 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 These changes are subtle. No ring or stricture identified. Esophagus dilated by passing 60 [place] Maloney dilator. The biopsies taken from mucosa of esophagus looking for EoE Biopsy negative for EoE; it does show some changes of reflux esophagitis. Patient states his swallowing has improved great deal. Patient will keep symptom diary until office visit in 6 months. Please send biopsy report to Dr. [name]    10/10/2013 DG Esophagram: Dysphagia. IMPRESSION:  No esophageal mass or stricture identified.  Questionable tiny filling defect versus artifact at the RIGHT  lateral aspect of the junction of the inferior hypopharynx with the  cervical esophagus; small polypoid lesion not excluded, recommend  direct visualization to exclude tumor. Review of Systems  Review of Systems Past Medical History  Diagnosis Date  . Asthma   . Asthma   . GERD (gastroesophageal reflux disease)     Past Surgical History  Procedure Laterality Date  . Sinus surgery with instatrak    . Pvc's    .  Esophagogastroduodenoscopy N/A 11/22/2013    Procedure: ESOPHAGOGASTRODUODENOSCOPY (EGD);  Surgeon: Rogene Houston, MD;  Location: [facility];  Service: Endoscopy;  Laterality: N/A;  1155  . Maloney dilation N/A 11/22/2013    Procedure: Venia Minks DILATION;  Surgeon: Rogene Houston, MD;  Location: [facility];  Service: Endoscopy;  Laterality: N/A;    Allergies  Allergen Reactions  . Penicillins Shortness Of Breath    REACTION: Higher dose resulted in breathing difficulty  . Bee Venom Swelling  . Codeine Nausea And Vomiting, Nausea Only and Other (See Comments)    REACTION: dizziness    Current Outpatient Prescriptions on File Prior to Visit  Medication Sig Dispense Refill  . albuterol (PROVENTIL HFA;VENTOLIN HFA) 108 (90 BASE) MCG/ACT inhaler Inhale 2 puffs into the lungs every 6 (six) hours as needed. For shortness of breath    . aspirin 81 MG tablet Take 81 mg by mouth daily.    . pantoprazole (PROTONIX) 40 MG tablet Take 1 tablet (40 mg total) by mouth 2 (two) times daily. 30 minutes before breakfast and 30 minutes before supper. 60 tablet 3   No current facility-administered medications on file prior to visit.        Objective:   Physical Exam Blood pressure 122/68, pulse 64, temperature 98.1 F (36.7 C), height 5\' 6"  (1.676 m), weight 188 lb 12.8 oz (85.639 kg).  Alert and oriented. Skin warm and dry. Oral mucosa is moist.   . Sclera anicteric, conjunctivae is pink. Thyroid not enlarged. No cervical lymphadenopathy. Lungs clear.  Heart regular rate and rhythm.  Abdomen is soft. Bowel sounds are positive. No hepatomegaly. No abdominal masses felt. No tenderness.  No edema to lower extremities.       Assessment & Plan:  GERD controlled at this time with Protonix. No dysphagia.  Patient has no complaints.  OV in 1 yr

## 2014-08-28 ENCOUNTER — Other Ambulatory Visit (INDEPENDENT_AMBULATORY_CARE_PROVIDER_SITE_OTHER): Payer: Self-pay | Admitting: Internal Medicine

## 2015-02-02 IMAGING — CT CT RENAL STONE PROTOCOL
2 of 4 series · 17 of 46 positions shown, 19 images · non-contrast
Comparison: None.

CLINICAL DATA: Severe right flank pain radiating to the back in the
right lower quadrant beginning earlier today. Testicular burning and
decreased urinary output of with 1 episode vomiting.

EXAM:
CT ABDOMEN AND PELVIS WITHOUT CONTRAST
TECHNIQUE: Multidetector CT imaging of the abdomen and pelvis was performed
following the standard protocol without IV contrast.

[Series 2: standard/full over (age)lbs 5.0 · axial · 0.72mm/px · z∈[-546,-106]mm · 14 of 96 slices shown, 16 images]
[im 4/96  soft-tissue]
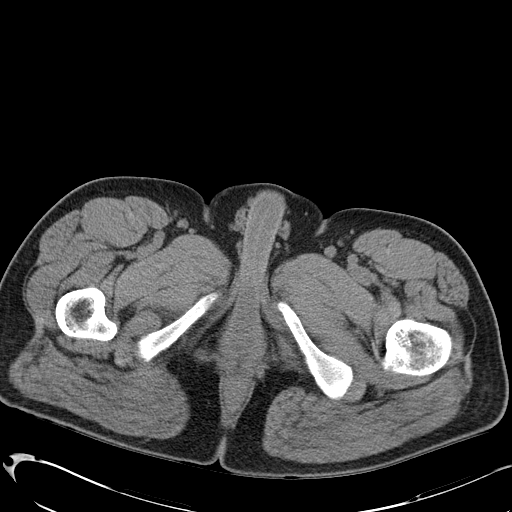
[im 4/96  bone]
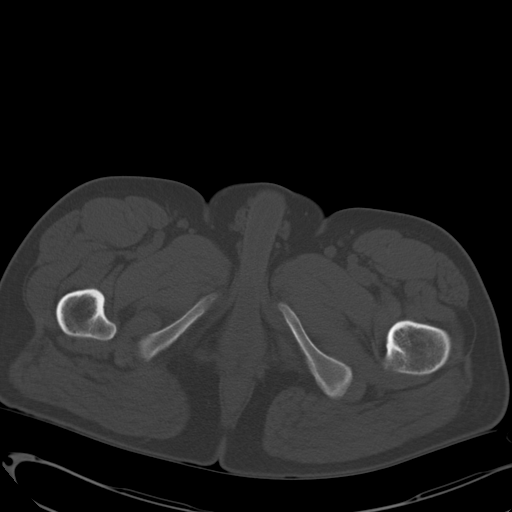
[im 12/96  soft-tissue]
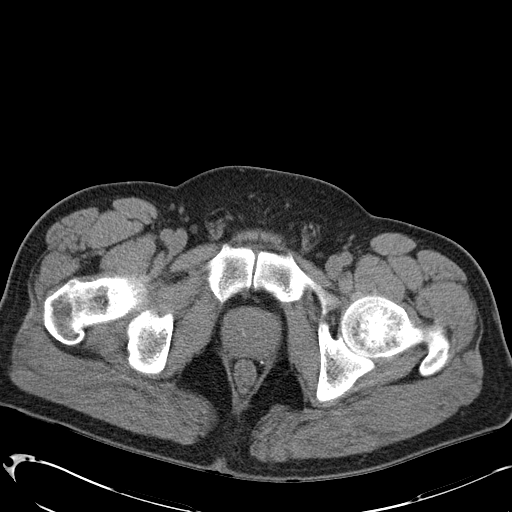
[im 20/96  soft-tissue]
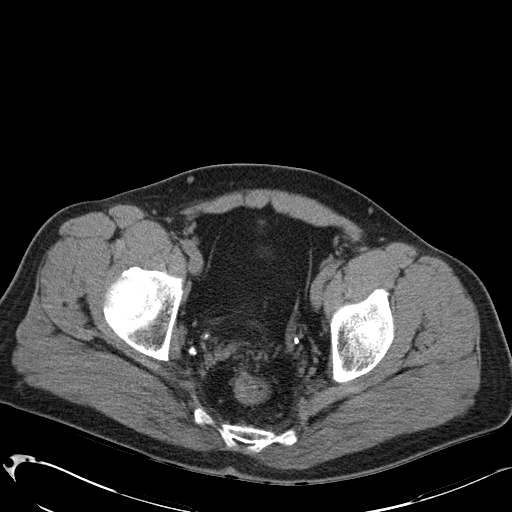
[im 24/96  soft-tissue]
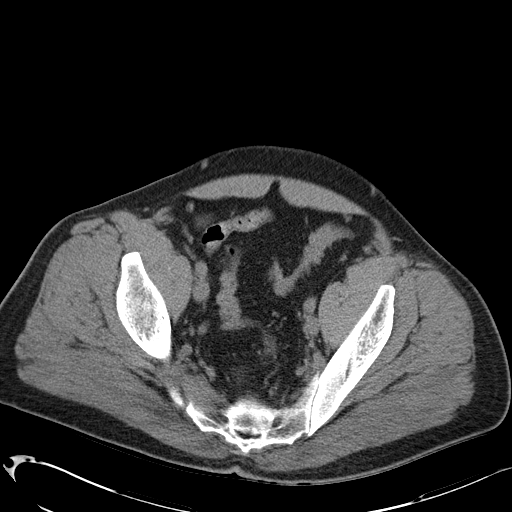
[im 32/96  soft-tissue]
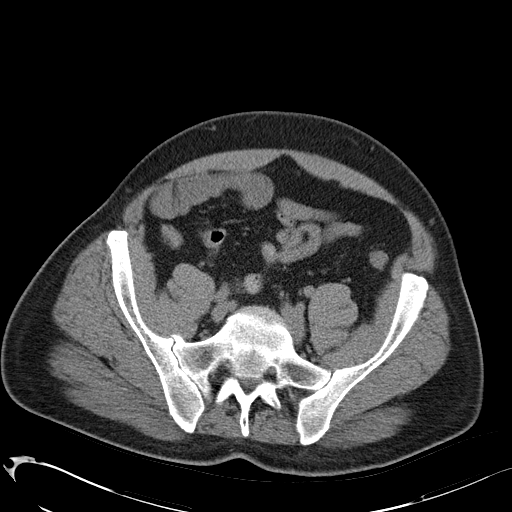
[im 40/96  soft-tissue]
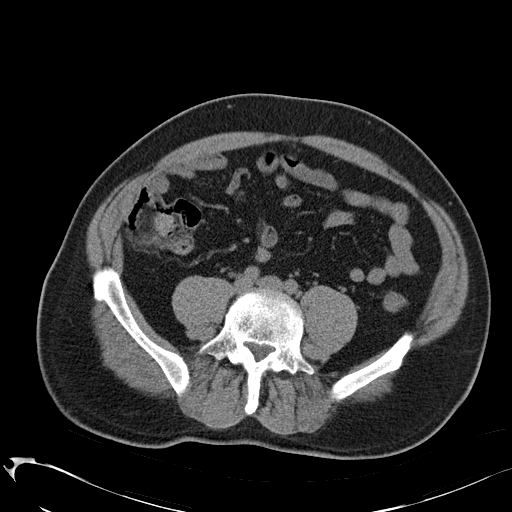
[im 44/96  soft-tissue]
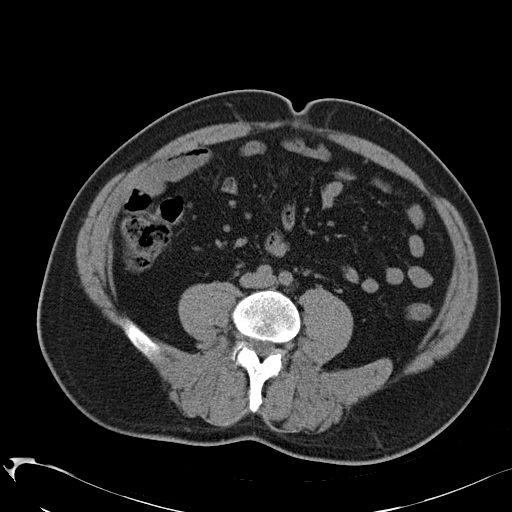
[im 52/96  soft-tissue]
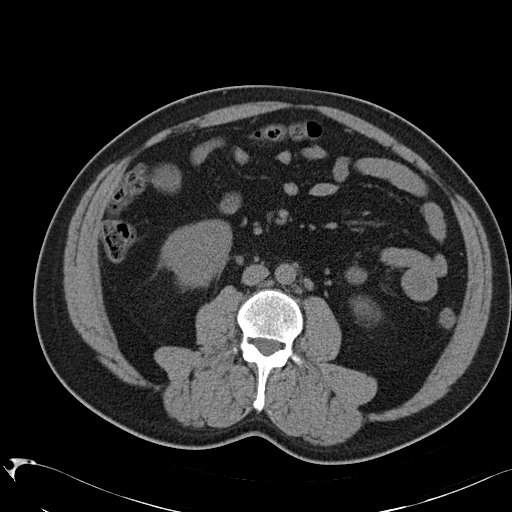
[im 56/96  soft-tissue]
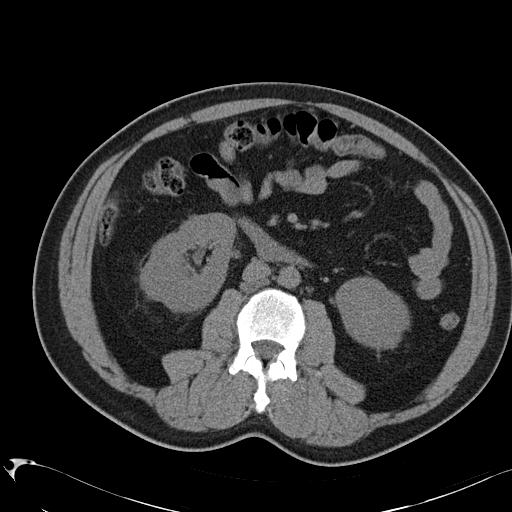
[im 56/96  bone]
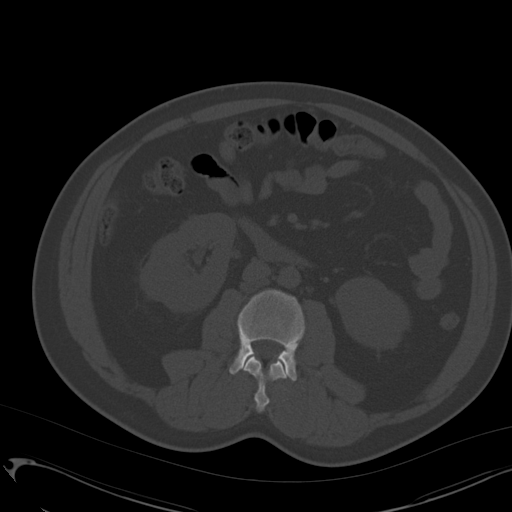
[im 64/96  soft-tissue]
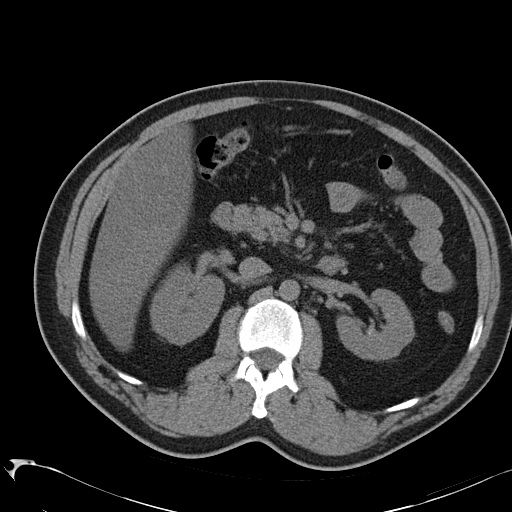
[im 72/96  soft-tissue]
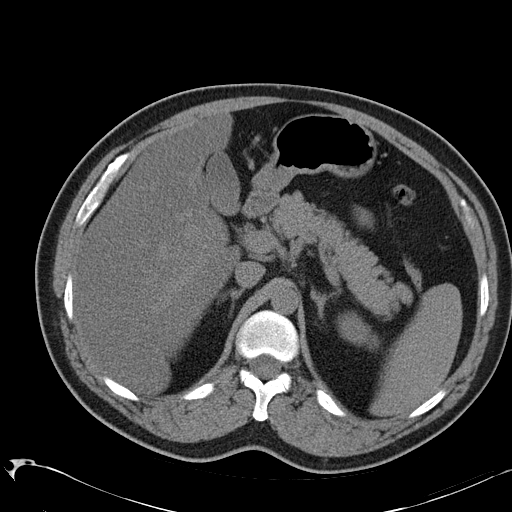
[im 76/96  soft-tissue]
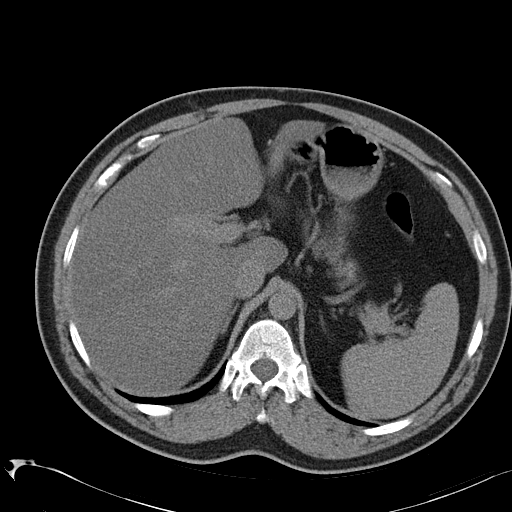
[im 84/96  soft-tissue]
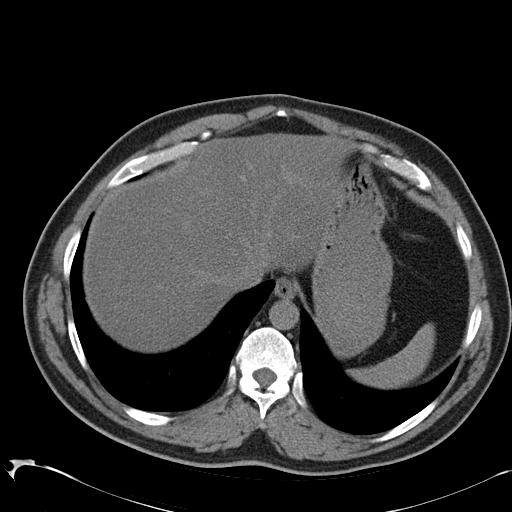
[im 92/96  soft-tissue]
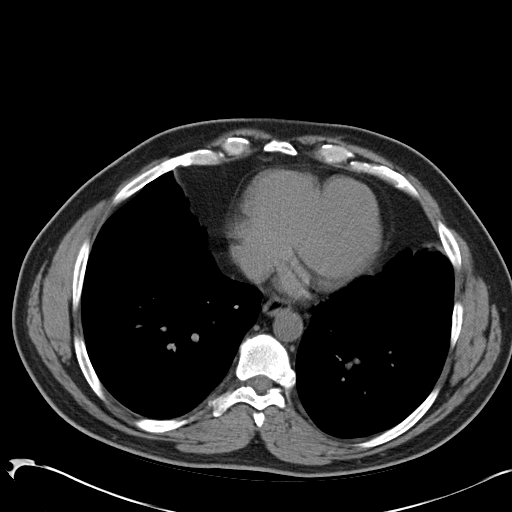

[Series 3: mpr coronal · coronal · 0.93mm/px · 3 of 93 slices shown]
[im 31/93  soft-tissue]
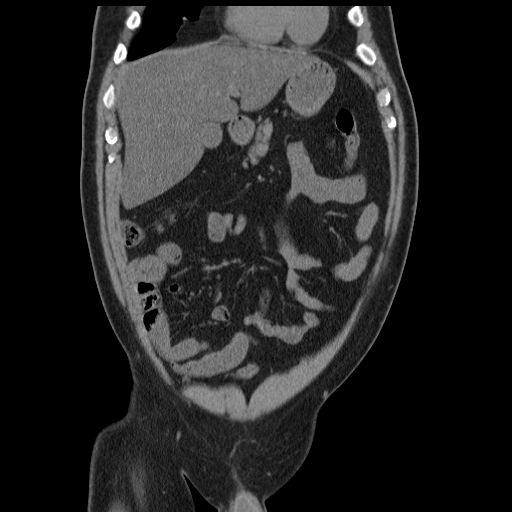
[im 41/93  soft-tissue]
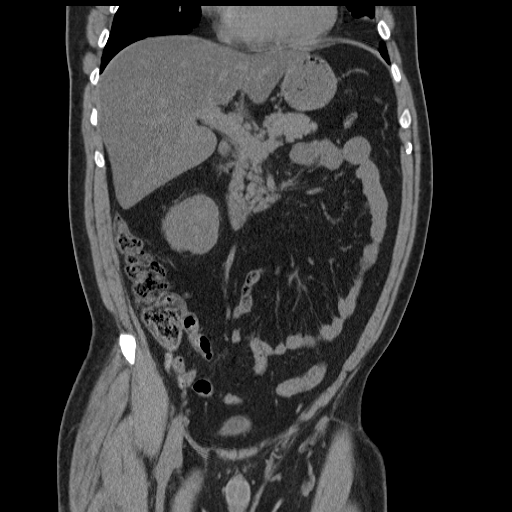
[im 52/93  soft-tissue]
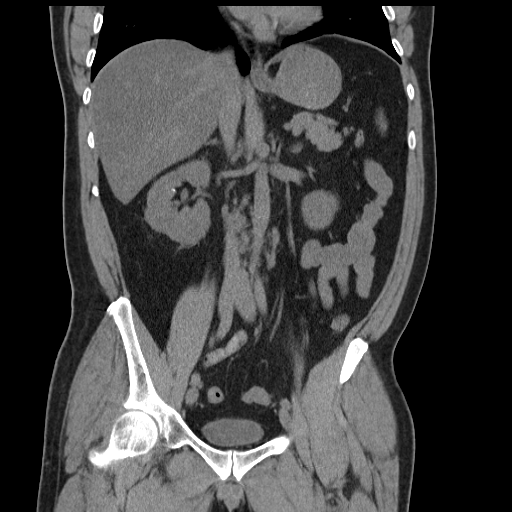

[17 of 46 positions shown; findings below may reference images not displayed]

FINDINGS: Visualized lung bases are clear.  No pleural effusion.

Diffusely low attenuation of the liver is consistent with moderate
steatosis. The gallbladder, spleen, adrenal glands, and pancreas
have an unremarkable unenhanced appearance. Two punctate right renal
calculi are noted measuring 2 mm in the interpolar kidney and 3 mm
in the lower pole. There are approximately 4 punctate nonobstructing
calculi in the left kidney, the largest measuring 3 mm in the lower
pole. There is a 2 mm calculus in the distal right ureter, and there
is mild right hydroureteronephrosis.

There is no evidence of bowel obstruction. Appendix is identified in
the right lower quadrant and is unremarkable. Bladder is
unremarkable. Prostate is within normal limits. No free fluid or
enlarged lymph nodes are identified. Mild lumbar spondylosis is
noted, with mild to moderate disc space narrowing and vacuum disc
phenomenon at L4-5.
IMPRESSION: 1. 3 mm stone in the distal right ureter with mild
hydroureteronephrosis.
2. Bilateral punctate nonobstructing renal calculi.
3. Hepatic steatosis.

## 2015-06-05 ENCOUNTER — Encounter (INDEPENDENT_AMBULATORY_CARE_PROVIDER_SITE_OTHER): Payer: Self-pay | Admitting: *Deleted

## 2015-07-06 ENCOUNTER — Other Ambulatory Visit (INDEPENDENT_AMBULATORY_CARE_PROVIDER_SITE_OTHER): Payer: Self-pay | Admitting: Internal Medicine

## 2015-08-25 ENCOUNTER — Ambulatory Visit (INDEPENDENT_AMBULATORY_CARE_PROVIDER_SITE_OTHER): Payer: Self-pay | Admitting: Internal Medicine

## 2016-01-30 ENCOUNTER — Other Ambulatory Visit (INDEPENDENT_AMBULATORY_CARE_PROVIDER_SITE_OTHER): Payer: Self-pay | Admitting: Internal Medicine

## 2016-02-02 NOTE — Telephone Encounter (Signed)
Talked with patient. He was informed that I would like for him to try Pantoprazole 40 mg qam.

## 2016-03-04 ENCOUNTER — Encounter (INDEPENDENT_AMBULATORY_CARE_PROVIDER_SITE_OTHER): Payer: Self-pay

## 2016-03-04 ENCOUNTER — Ambulatory Visit (INDEPENDENT_AMBULATORY_CARE_PROVIDER_SITE_OTHER): Payer: Managed Care, Other (non HMO) | Admitting: Internal Medicine

## 2016-03-04 ENCOUNTER — Encounter (INDEPENDENT_AMBULATORY_CARE_PROVIDER_SITE_OTHER): Payer: Self-pay | Admitting: Internal Medicine

## 2016-03-04 VITALS — BP 122/80 | HR 72 | Temp 98.7°F | Ht 66.0 in | Wt 193.5 lb

## 2016-03-04 DIAGNOSIS — J328 Other chronic sinusitis: Secondary | ICD-10-CM

## 2016-03-04 DIAGNOSIS — K219 Gastro-esophageal reflux disease without esophagitis: Secondary | ICD-10-CM

## 2016-03-04 MED ORDER — LEVOFLOXACIN 500 MG PO TABS: 500.0000 mg | ORAL_TABLET | Freq: Every day | ORAL | 1 refills | Status: DC

## 2016-03-04 NOTE — Patient Instructions (Signed)
PR in 2 weeks

## 2016-03-04 NOTE — Progress Notes (Signed)
   Subjective:    Patient ID: Seth Hartman, male    DOB: [birth year], 51 y.o.   MRN: [id number]  HPI Here today for f/u. He was last seen in October of last year. States Dr. Laural Golden called him about 2 weeks ago and was told to decrease the Protonix to once a day instead of twice a day. He tells me since Sunday, he has been trying to clear his throat. He says he has a cough. He says he has a sinus problem. Feels like his nose is "stopped up". He has started taking the Protonix BID again.  He says he seems better.  He feels like he has a sinus infection. He says he does not feel good today. Eyes are reddened today.     10 /16/2015  EGD with ED.  Indications:  Patient is a 51 year old Caucasian male who presents with a one-year history of dysphagia primarily to solids. BPE reveals questionable abnormality at junction of hypopharynx and esophagus an evaluation by Dr. [name] was negative. He is undergoing diagnostic and therapeutic EGD. He is having less difficulty swallowing since he was placed on omeprazole by Dr. [name] but he does not have heartburn. 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 These changes are subtle. No ring or stricture identified. Esophagus dilated by passing 74 [place] Maloney dilator. The biopsies taken from mucosa of esophagus looking for EoE  Biopsy results reviewed with patient. Biopsy negative for EoE; it does show some changes of reflux esophagitis.    Review of Systems     Objective:   Physical Exam Blood pressure 122/80, pulse 72, temperature 98.7 F (37.1 C), height 5\' 6"  (1.676 m), weight 193 lb 8 oz (87.8 kg).  Alert and oriented. Skin warm and dry. Oral mucosa is moist.   . Sclera anicteric, conjunctivae is pink. Thyroid not enlarged. No cervical lymphadenopathy. Lungs clear. Heart regular rate and rhythm.  Abdomen is soft. Bowel sounds are positive. No hepatomegaly. No abdominal masses felt. No tenderness.  No  edema to lower extremities.        Assessment & Plan:  Sinusitis. Am going to cover with Levaquin. He will call me in 2 weeks. I may reduce to Protonix at that time.

## 2016-04-28 ENCOUNTER — Ambulatory Visit (HOSPITAL_COMMUNITY)
Admission: RE | Admit: 2016-04-28 | Discharge: 2016-04-28 | Disposition: A | Discharge status: Home / Self Care | Payer: Managed Care, Other (non HMO) | Source: Ambulatory Visit | Attending: Family Medicine | Admitting: Family Medicine

## 2016-04-28 ENCOUNTER — Other Ambulatory Visit (HOSPITAL_COMMUNITY): Payer: Self-pay | Admitting: Family Medicine

## 2016-04-28 DIAGNOSIS — J45909 Unspecified asthma, uncomplicated: Secondary | ICD-10-CM | POA: Diagnosis present

## 2016-12-27 ENCOUNTER — Other Ambulatory Visit (INDEPENDENT_AMBULATORY_CARE_PROVIDER_SITE_OTHER): Payer: Self-pay | Admitting: Internal Medicine

## 2017-02-06 ENCOUNTER — Encounter (INDEPENDENT_AMBULATORY_CARE_PROVIDER_SITE_OTHER): Payer: Self-pay | Admitting: Internal Medicine

## 2017-03-06 ENCOUNTER — Ambulatory Visit (INDEPENDENT_AMBULATORY_CARE_PROVIDER_SITE_OTHER): Payer: Self-pay | Admitting: Internal Medicine

## 2018-05-08 ENCOUNTER — Telehealth (INDEPENDENT_AMBULATORY_CARE_PROVIDER_SITE_OTHER): Payer: Self-pay | Admitting: Internal Medicine

## 2018-05-08 NOTE — Telephone Encounter (Signed)
Find out what refill. We have not seen him in 2 years May need a virtual visit.

## 2018-05-08 NOTE — Telephone Encounter (Signed)
Patient left message stating he needs refill - please call [phone]

## 2018-05-10 ENCOUNTER — Other Ambulatory Visit: Payer: Self-pay

## 2018-05-10 ENCOUNTER — Ambulatory Visit (INDEPENDENT_AMBULATORY_CARE_PROVIDER_SITE_OTHER): Payer: Managed Care, Other (non HMO) | Admitting: Internal Medicine

## 2018-05-10 ENCOUNTER — Encounter (INDEPENDENT_AMBULATORY_CARE_PROVIDER_SITE_OTHER): Payer: Self-pay | Admitting: Internal Medicine

## 2018-05-10 DIAGNOSIS — K219 Gastro-esophageal reflux disease without esophagitis: Secondary | ICD-10-CM | POA: Diagnosis not present

## 2018-05-10 DIAGNOSIS — R1310 Dysphagia, unspecified: Secondary | ICD-10-CM

## 2018-05-10 DIAGNOSIS — R1319 Other dysphagia: Secondary | ICD-10-CM

## 2018-05-10 MED ORDER — PANTOPRAZOLE SODIUM 40 MG PO TBEC: DELAYED_RELEASE_TABLET | ORAL | 5 refills | Status: DC

## 2018-05-10 NOTE — Progress Notes (Addendum)
   Subjective:    Patient ID: Seth Hartman, male    DOB: 12-12-1965, 53 y.o.   MRN: [id number]  HPI This is a telephone office visit. Time started 214pm-230pm. Total time 15 minuytes.  Patient agreed to this OV with me. He is at home. I am in the office.Unable to do video visit. Telephone visit to to COVID-19.   He was last seen in office in 2018 with a sinus infection.  He has a hx of dysphagia.  He tells me he is doing good. He ran out of the Protonix a couple of weeks ago and noticed he was having some dysphagia.   He also started havig mucous in his esophagus.   Has been under a lot of stress. He says he was exposed to the COVID-19  last visit. He says he had his mask on and protective gear on. His place of work is aware. 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 Appetite is good. No weight loss. Has a BM daily. No melena or BRRB.   10/16/2015EGD with ED.  Indications:Patient is a 53 year old Caucasian male who presents with a one-year history of dysphagia primarily to solids. BPE reveals questionable abnormality at junction of hypopharynx and esophagus an evaluation by Dr. [name] was negative. He is undergoing diagnostic and therapeutic EGD. He is having less difficulty swallowing since he was placed on omeprazole by Dr. [name] but he does not have heartburn. 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 These changes are subtle. No ring or stricture identified. Esophagus dilated by passing 82 [place] Maloney dilator. The biopsies taken from mucosa of esophagus looking for EoE  Biopsy results reviewed with patient. Biopsy negative for EoE; it does show some changes of reflux esophagitis.  Review of Systems Past Medical History:  Diagnosis Date  . Asthma   . Asthma   . GERD (gastroesophageal reflux disease)     Past Surgical History:  Procedure Laterality Date  . ESOPHAGOGASTRODUODENOSCOPY N/A 11/22/2013   Procedure: ESOPHAGOGASTRODUODENOSCOPY  (EGD);  Surgeon: Rogene Houston, MD;  [facility]: [facility];  Service: Endoscopy;  Laterality: N/A;  1155  . MALONEY DILATION N/A 11/22/2013   Procedure: Venia Minks DILATION;  Surgeon: Rogene Houston, MD;  [facility]: [facility];  Service: Endoscopy;  Laterality: N/A;  . pvc's    . SINUS SURGERY WITH INSTATRAK      Allergies  Allergen Reactions  . Penicillins Shortness Of Breath    REACTION: Higher dose resulted in breathing difficulty  . Bee Venom Swelling  . Codeine Nausea And Vomiting, Nausea Only and Other (See Comments)    REACTION: dizziness    Current Outpatient Medications on File Prior to Visit  Medication Sig Dispense Refill  . albuterol (PROVENTIL HFA;VENTOLIN HFA) 108 (90 BASE) MCG/ACT inhaler Inhale 2 puffs into the lungs every 6 (six) hours as needed. For shortness of breath    . aspirin 81 MG tablet Take 81 mg by mouth daily.     No current facility-administered medications on file prior to visit.         Objective:   Physical Exam deferred       Assessment & Plan:  GERD. Will restart the Protonix 40mg  BID. Dysphagia: He will call with a PR report in 2 weeks. OV in 1 year.

## 2018-05-10 NOTE — Patient Instructions (Addendum)
Rx for Protonix 40mg  BID sent to his pharmacy.  PR in 2 weeks about his dysphagia.

## 2019-02-19 ENCOUNTER — Other Ambulatory Visit: Payer: Managed Care, Other (non HMO)

## 2019-05-13 ENCOUNTER — Ambulatory Visit (INDEPENDENT_AMBULATORY_CARE_PROVIDER_SITE_OTHER): Payer: Managed Care, Other (non HMO) | Admitting: Nurse Practitioner

## 2019-06-24 ENCOUNTER — Ambulatory Visit (INDEPENDENT_AMBULATORY_CARE_PROVIDER_SITE_OTHER): Payer: Managed Care, Other (non HMO) | Admitting: Internal Medicine

## 2019-06-24 ENCOUNTER — Other Ambulatory Visit: Payer: Self-pay

## 2019-06-24 ENCOUNTER — Encounter (INDEPENDENT_AMBULATORY_CARE_PROVIDER_SITE_OTHER): Payer: Self-pay | Admitting: Internal Medicine

## 2019-06-24 VITALS — BP 122/77 | HR 76 | Temp 97.3°F | Ht 72.0 in | Wt 200.4 lb

## 2019-06-24 DIAGNOSIS — K219 Gastro-esophageal reflux disease without esophagitis: Secondary | ICD-10-CM | POA: Insufficient documentation

## 2019-06-24 DIAGNOSIS — R1319 Other dysphagia: Secondary | ICD-10-CM

## 2019-06-24 DIAGNOSIS — R1310 Dysphagia, unspecified: Secondary | ICD-10-CM | POA: Diagnosis not present

## 2019-06-24 MED ORDER — PANTOPRAZOLE SODIUM 40 MG PO TBEC: 40.0000 mg | DELAYED_RELEASE_TABLET | Freq: Every day | ORAL | 3 refills | Status: DC

## 2019-06-24 NOTE — Patient Instructions (Signed)
Esophagogastroduodenoscopy with esophageal dilation and screening colonoscopy to be scheduled

## 2019-06-24 NOTE — Progress Notes (Signed)
Presenting complaint;  Follow-up chronic GERD. Patient complains of dysphagia.  Database and subjective:  Patient is 54 year old Caucasian man who has chronic GERD who is here for yearly visit.  His last EGD was in October 2015 when he also underwent esophageal dilation.  Esophageal mucosal appearance was abnormal (patient biopsy was negative for eosinophilic esophagitis.  His esophagus was dilated by passing 54 [place] Maloney dilator but no mucosal disruption was noted. He now presents with dysphagia.  He states he has had it off and on for 1 year and has been occurring more frequent lately.  He has most difficulty with meats.  He says he is not a slow eater.  He may have an episode of food impaction once a month relieved with regurgitation.  He feels heartburn is well controlled with therapy.  He denies hoarseness cough or sore throat.  He says his appetite is very good.  He has gained 10 pounds in the last year or so.  He states he is very active.  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  He is also interested in screening colonoscopy.  He denies diarrhea constipation melena or rectal bleeding.  His bowels generally move every day.  He says his grandmother had colon carcinoma in her 58s. He has never smoked cigarettes and he does not drink alcohol.  Current Medications: Outpatient Encounter Medications as of 06/24/2019  Medication Sig  . albuterol (PROVENTIL HFA;VENTOLIN HFA) 108 (90 BASE) MCG/ACT inhaler Inhale 2 puffs into the lungs every 6 (six) hours as needed. For shortness of breath  . Ascorbic Acid (VITAMIN C ADULT GUMMIES PO) Take 500 mg by mouth. Patient states that this is chewable tablet orange flavored  . aspirin 81 MG tablet Take 81 mg by mouth daily.  . pantoprazole (PROTONIX) 40 MG tablet TAKE (1) TABLET BY MOUTH TWICE DAILY.  [name] VITAMIN D PO Take 1,000 Units by mouth daily.   No facility-administered encounter medications on file as of 06/24/2019.      Objective:  Blood pressure 122/77, pulse 76, temperature (!) 97.3 F (36.3 C), temperature source Temporal, height 6' (1.829 m), weight 200 lb 6.4 oz (90.9 kg). Patient is alert and in no acute distress. He is wearing facial mask. Conjunctiva is pink. Sclera is nonicteric Oropharyngeal mucosa is normal. No neck masses or thyromegaly noted. Cardiac exam with regular rhythm normal S1 and S2. No murmur or gallop noted. Lungs are clear to auscultation. Abdomen is full but soft and nontender with no organomegaly or masses. No LE edema or clubbing noted.  Assessment:  #1.  GERD.  He is doing well with antireflux measures and single dose of PPI.  Heartburn is well controlled and he is not having throat symptoms.  #2.  Esophageal dysphagia.  His esophagus was last dilated November 2015 and did respond to dilation.  Esophageal biopsy was negative for eosinophilic esophagitis.  #3.  Patient is average risk for CRC.  He only has 1 secondary to have had colon carcinoma at late onset.  Plan:  New prescription given for pantoprazole 40 mg by mouth 30 minutes before breakfast daily.  90 doses with 3 refills. We will schedule patient for esophagogastroduodenoscopy with esophageal dilation and screening colonoscopy. Office visit in 1 year.

## 2019-07-02 ENCOUNTER — Encounter (INDEPENDENT_AMBULATORY_CARE_PROVIDER_SITE_OTHER): Payer: Self-pay | Admitting: *Deleted

## 2019-07-02 ENCOUNTER — Telehealth (INDEPENDENT_AMBULATORY_CARE_PROVIDER_SITE_OTHER): Payer: Self-pay | Admitting: *Deleted

## 2019-07-02 ENCOUNTER — Other Ambulatory Visit (INDEPENDENT_AMBULATORY_CARE_PROVIDER_SITE_OTHER): Payer: Self-pay | Admitting: *Deleted

## 2019-07-02 DIAGNOSIS — K219 Gastro-esophageal reflux disease without esophagitis: Secondary | ICD-10-CM

## 2019-07-02 DIAGNOSIS — R1319 Other dysphagia: Secondary | ICD-10-CM

## 2019-07-02 DIAGNOSIS — Z1211 Encounter for screening for malignant neoplasm of colon: Secondary | ICD-10-CM

## 2019-07-02 MED ORDER — SUTAB 1479-225-188 MG PO TABS: 1.0000 | ORAL_TABLET | Freq: Once | ORAL | 0 refills | Status: AC

## 2019-07-02 NOTE — Telephone Encounter (Signed)
Patient needs Sutab (copay card) ° °

## 2019-07-29 NOTE — Patient Instructions (Signed)
STANLEY LYNESS  07/29/2019     @PREFPERIOPPHARMACY @   Your procedure is scheduled on  08/02/2019   Report [facility] at  [place].  Call this number if you have problems the morning of surgery:  [phone]   Remember:  Follow the diet and prep instructions given to you by Dr Olevia Perches office.                         Take these medicines the morning of surgery with A SIP OF WATER  Protonix, sutab.    Do not wear jewelry, make-up or nail polish.  Do not wear lotions, powders, or perfumes. Please wear deodorant and brush your teeth.  Do not shave 48 hours prior to surgery.  Men may shave face and neck.  Do not bring valuables to the hospital.  [facility] is not responsible for any belongings or valuables.  Contacts, dentures or bridgework may not be worn into surgery.  Leave your suitcase in the car.  After surgery it may be brought to your room.  For patients admitted to the hospital, discharge time will be determined by your treatment team.  Patients discharged the day of surgery will not be allowed to drive home.   Name and phone number of your driver:   family Special instructions:  DO NOT smoke the morning of your procedure  Please read over the following fact sheets that you were given. Anesthesia Post-op Instructions and Care and Recovery After Surgery       Upper Endoscopy, Adult, Care After This sheet gives you information about how to care for yourself after your procedure. Your health care provider may also give you more specific instructions. If you have problems or questions, contact your health care provider. What can I expect after the procedure? After the procedure, it is common to have:  A sore throat.  Mild stomach pain or discomfort.  Bloating.  Nausea. Follow these instructions at home:   Follow instructions from your health care provider about what to eat or drink after your procedure.  Return to your normal activities  as told by your health care provider. Ask your health care provider what activities are safe for you.  Take over-the-counter and prescription medicines only as told by your health care provider.  Do not drive for 24 hours if you were given a sedative during your procedure.  Keep all follow-up visits as told by your health care provider. This is important. Contact a health care provider if you have:  A sore throat that lasts longer than one day.  Trouble swallowing. Get help right away if:  You vomit blood or your vomit looks like coffee grounds.  You have: ? A fever. ? Bloody, black, or tarry stools. ? A severe sore throat or you cannot swallow. ? Difficulty breathing. ? Severe pain in your chest or abdomen. Summary  After the procedure, it is common to have a sore throat, mild stomach discomfort, bloating, and nausea.  Do not drive for 24 hours if you were given a sedative during the procedure.  Follow instructions from your health care provider about what to eat or drink after your procedure.  Return to your normal activities as told by your health care provider. This information is not intended to replace advice given to you by your health care provider. Make sure you discuss any questions you have  with your health care provider. Document Revised: 07/18/2017 Document Reviewed: 06/26/2017 Elsevier Patient Education  [place].  Esophageal Dilatation Esophageal dilatation, also called esophageal dilation, is a procedure to widen or open (dilate) a blocked or narrowed part of the esophagus. The esophagus is the part of the body that moves food and liquid from the mouth to the stomach. You may need this procedure if:  You have a buildup of scar tissue in your esophagus that makes it difficult, painful, or impossible to swallow. This can be caused by gastroesophageal reflux disease (GERD).  You have cancer of the esophagus.  There is a problem with how food moves  through your esophagus. In some cases, you may need this procedure repeated at a later time to dilate the esophagus gradually. Tell a health care provider about:  Any allergies you have.  All medicines you are taking, including vitamins, herbs, eye drops, creams, and over-the-counter medicines.  Any problems you or family members have had with anesthetic medicines.  Any blood disorders you have.  Any surgeries you have had.  Any medical conditions you have.  Any antibiotic medicines you are required to take before dental procedures.  Whether you are pregnant or may be pregnant. What are the risks? Generally, this is a safe procedure. However, problems may occur, including:  Bleeding due to a tear in the lining of the esophagus.  A hole (perforation) in the esophagus. What happens before the procedure?  Follow instructions from your health care provider about eating or drinking restrictions.  Ask your health care provider about changing or stopping your regular medicines. This is especially important if you are taking diabetes medicines or blood thinners.  Plan to have someone take you home from the hospital or clinic.  Plan to have a responsible adult care for you for at least 24 hours after you leave the hospital or clinic. This is important. What happens during the procedure?  You may be given a medicine to help you relax (sedative).  A numbing medicine may be sprayed into the back of your throat, or you may gargle the medicine.  Your health care provider may perform the dilatation using various surgical instruments, such as: ? Simple dilators. This instrument is carefully placed in the esophagus to stretch it. ? Guided wire bougies. This involves using an endoscope to insert a wire into the esophagus. A dilator is passed over this wire to enlarge the esophagus. Then the wire is removed. ? Balloon dilators. An endoscope with a small balloon at the end is inserted into the  esophagus. The balloon is inflated to stretch the esophagus and open it up. The procedure may vary among health care providers and hospitals. What happens after the procedure?  Your blood pressure, heart rate, breathing rate, and blood oxygen level will be monitored until the medicines you were given have worn off.  Your throat may feel slightly sore and numb. This will improve slowly over time.  You will not be allowed to eat or drink until your throat is no longer numb.  When you are able to drink, urinate, and sit on the edge of the bed without nausea or dizziness, you may be able to return home. Follow these instructions at home:  Take over-the-counter and prescription medicines only as told by your health care provider.  Do not drive for 24 hours if you were given a sedative during your procedure.  You should have a responsible adult with you for 24 hours  after the procedure.  Follow instructions from your health care provider about any eating or drinking restrictions.  Do not use any products that contain nicotine or tobacco, such as cigarettes and e-cigarettes. If you need help quitting, ask your health care provider.  Keep all follow-up visits as told by your health care provider. This is important. Get help right away if you:  Have a fever.  Have chest pain.  Have pain that is not relieved by medication.  Have trouble breathing.  Have trouble swallowing.  Vomit blood. Summary  Esophageal dilatation, also called esophageal dilation, is a procedure to widen or open (dilate) a blocked or narrowed part of the esophagus.  Plan to have someone take you home from the hospital or clinic.  For this procedure, a numbing medicine may be sprayed into the back of your throat, or you may gargle the medicine.  Do not drive for 24 hours if you were given a sedative during your procedure. This information is not intended to replace advice given to you by your health care  provider. Make sure you discuss any questions you have with your health care provider. Document Revised: 11/21/2018 Document Reviewed: 11/29/2016 Elsevier Patient Education  [place].  Colonoscopy, Adult, Care After This sheet gives you information about how to care for yourself after your procedure. Your health care provider may also give you more specific instructions. If you have problems or questions, contact your health care provider. What can I expect after the procedure? After the procedure, it is common to have:  A small amount of blood in your stool for 24 hours after the procedure.  Some gas.  Mild cramping or bloating of your abdomen. Follow these instructions at home: Eating and drinking   Drink enough fluid to keep your urine pale yellow.  Follow instructions from your health care provider about eating or drinking restrictions.  Resume your normal diet as instructed by your health care provider. Avoid heavy or fried foods that are hard to digest. Activity  Rest as told by your health care provider.  Avoid sitting for a long time without moving. Get up to take short walks every 1-2 hours. This is important to improve blood flow and breathing. Ask for help if you feel weak or unsteady.  Return to your normal activities as told by your health care provider. Ask your health care provider what activities are safe for you. Managing cramping and bloating   Try walking around when you have cramps or feel bloated.  Apply heat to your abdomen as told by your health care provider. Use the heat source that your health care provider recommends, such as a moist heat pack or a heating pad. ? Place a towel between your skin and the heat source. ? Leave the heat on for 20-30 minutes. ? Remove the heat if your skin turns bright red. This is especially important if you are unable to feel pain, heat, or cold. You may have a greater risk of getting burned. General  instructions  For the first 24 hours after the procedure: ? Do not drive or use machinery. ? Do not sign important documents. ? Do not drink alcohol. ? Do your regular daily activities at a slower pace than normal. ? Eat soft foods that are easy to digest.  Take over-the-counter and prescription medicines only as told by your health care provider.  Keep all follow-up visits as told by your health care provider. This is important. Contact a health  care provider if:  You have blood in your stool 2-3 days after the procedure. Get help right away if you have:  More than a small spotting of blood in your stool.  Large blood clots in your stool.  Swelling of your abdomen.  Nausea or vomiting.  A fever.  Increasing pain in your abdomen that is not relieved with medicine. Summary  After the procedure, it is common to have a small amount of blood in your stool. You may also have mild cramping and bloating of your abdomen.  For the first 24 hours after the procedure, do not drive or use machinery, sign important documents, or drink alcohol.  Get help right away if you have a lot of blood in your stool, nausea or vomiting, a fever, or increased pain in your abdomen. This information is not intended to replace advice given to you by your health care provider. Make sure you discuss any questions you have with your health care provider. Document Revised: 08/20/2018 Document Reviewed: 08/20/2018 Elsevier Patient Education  [place] After These instructions provide you with information about caring for yourself after your procedure. Your health care provider may also give you more specific instructions. Your treatment has been planned according to current medical practices, but problems sometimes occur. Call your health care provider if you have any problems or questions after your procedure. What can I expect after the procedure? After your  procedure, you may:  Feel sleepy for several hours.  Feel clumsy and have poor balance for several hours.  Feel forgetful about what happened after the procedure.  Have poor judgment for several hours.  Feel nauseous or vomit.  Have a sore throat if you had a breathing tube during the procedure. Follow these instructions at home: For at least 24 hours after the procedure:      Have a responsible adult stay with you. It is important to have someone help care for you until you are awake and alert.  Rest as needed.  Do not: ? Participate in activities in which you could fall or become injured. ? Drive. ? Use heavy machinery. ? Drink alcohol. ? Take sleeping pills or medicines that cause drowsiness. ? Make important decisions or sign legal documents. ? Take care of children on your own. Eating and drinking  Follow the diet that is recommended by your health care provider.  If you vomit, drink water, juice, or soup when you can drink without vomiting.  Make sure you have little or no nausea before eating solid foods. General instructions  Take over-the-counter and prescription medicines only as told by your health care provider.  If you have sleep apnea, surgery and certain medicines can increase your risk for breathing problems. Follow instructions from your health care provider about wearing your sleep device: ? Anytime you are sleeping, including during daytime naps. ? While taking prescription pain medicines, sleeping medicines, or medicines that make you drowsy.  If you smoke, do not smoke without supervision.  Keep all follow-up visits as told by your health care provider. This is important. Contact a health care provider if:  You keep feeling nauseous or you keep vomiting.  You feel light-headed.  You develop a rash.  You have a fever. Get help right away if:  You have trouble breathing. Summary  For several hours after your procedure, you may feel  sleepy and have poor judgment.  Have a responsible adult stay with you for at least  24 hours or until you are awake and alert. This information is not intended to replace advice given to you by your health care provider. Make sure you discuss any questions you have with your health care provider. Document Revised: 04/24/2017 Document Reviewed: 05/17/2015 Elsevier Patient Education  [place].

## 2019-07-31 ENCOUNTER — Other Ambulatory Visit: Payer: Self-pay

## 2019-07-31 ENCOUNTER — Encounter (HOSPITAL_COMMUNITY)
Admission: RE | Admit: 2019-07-31 | Discharge: 2019-07-31 | Disposition: A | Discharge status: Home / Self Care | Payer: Managed Care, Other (non HMO) | Source: Ambulatory Visit | Attending: Internal Medicine | Admitting: Internal Medicine

## 2019-07-31 ENCOUNTER — Other Ambulatory Visit (HOSPITAL_COMMUNITY)
Admission: RE | Admit: 2019-07-31 | Discharge: 2019-07-31 | Disposition: A | Discharge status: Home / Self Care | Payer: Managed Care, Other (non HMO) | Source: Ambulatory Visit | Attending: Internal Medicine | Admitting: Internal Medicine

## 2019-07-31 ENCOUNTER — Encounter (HOSPITAL_COMMUNITY): Payer: Self-pay

## 2019-07-31 DIAGNOSIS — Z01812 Encounter for preprocedural laboratory examination: Secondary | ICD-10-CM | POA: Insufficient documentation

## 2019-07-31 DIAGNOSIS — Z20822 Contact with and (suspected) exposure to covid-19: Secondary | ICD-10-CM | POA: Insufficient documentation

## 2019-07-31 DIAGNOSIS — K219 Gastro-esophageal reflux disease without esophagitis: Secondary | ICD-10-CM

## 2019-07-31 DIAGNOSIS — R1319 Other dysphagia: Secondary | ICD-10-CM

## 2019-07-31 DIAGNOSIS — Z1211 Encounter for screening for malignant neoplasm of colon: Secondary | ICD-10-CM

## 2019-07-31 HISTORY — DX: Nausea with vomiting, unspecified: R11.2

## 2019-07-31 HISTORY — DX: Other specified postprocedural states: Z98.890

## 2019-08-01 LAB — SARS CORONAVIRUS 2 (TAT 6-24 HRS): SARS Coronavirus 2: NEGATIVE

## 2019-08-02 ENCOUNTER — Encounter (HOSPITAL_COMMUNITY): Payer: Self-pay | Admitting: Internal Medicine

## 2019-08-02 ENCOUNTER — Ambulatory Visit (HOSPITAL_COMMUNITY): Payer: Managed Care, Other (non HMO) | Admitting: Anesthesiology

## 2019-08-02 ENCOUNTER — Encounter (HOSPITAL_COMMUNITY)
Admission: RE | Disposition: A | Discharge status: Home / Self Care | Payer: Self-pay | Source: Home / Self Care | Attending: Internal Medicine

## 2019-08-02 ENCOUNTER — Ambulatory Visit (HOSPITAL_COMMUNITY)
Admission: RE | Admit: 2019-08-02 | Discharge: 2019-08-02 | Disposition: A | Discharge status: Home / Self Care | Payer: Managed Care, Other (non HMO) | Attending: Internal Medicine | Admitting: Internal Medicine

## 2019-08-02 DIAGNOSIS — K219 Gastro-esophageal reflux disease without esophagitis: Secondary | ICD-10-CM

## 2019-08-02 DIAGNOSIS — R1319 Other dysphagia: Secondary | ICD-10-CM

## 2019-08-02 DIAGNOSIS — K644 Residual hemorrhoidal skin tags: Secondary | ICD-10-CM | POA: Diagnosis not present

## 2019-08-02 DIAGNOSIS — D127 Benign neoplasm of rectosigmoid junction: Secondary | ICD-10-CM

## 2019-08-02 DIAGNOSIS — K2970 Gastritis, unspecified, without bleeding: Secondary | ICD-10-CM | POA: Diagnosis not present

## 2019-08-02 DIAGNOSIS — D125 Benign neoplasm of sigmoid colon: Secondary | ICD-10-CM | POA: Diagnosis not present

## 2019-08-02 DIAGNOSIS — Z7982 Long term (current) use of aspirin: Secondary | ICD-10-CM | POA: Diagnosis not present

## 2019-08-02 DIAGNOSIS — K259 Gastric ulcer, unspecified as acute or chronic, without hemorrhage or perforation: Secondary | ICD-10-CM | POA: Diagnosis not present

## 2019-08-02 DIAGNOSIS — K6289 Other specified diseases of anus and rectum: Secondary | ICD-10-CM | POA: Diagnosis not present

## 2019-08-02 DIAGNOSIS — Z880 Allergy status to penicillin: Secondary | ICD-10-CM | POA: Insufficient documentation

## 2019-08-02 DIAGNOSIS — D128 Benign neoplasm of rectum: Secondary | ICD-10-CM | POA: Diagnosis not present

## 2019-08-02 DIAGNOSIS — Z91030 Bee allergy status: Secondary | ICD-10-CM | POA: Insufficient documentation

## 2019-08-02 DIAGNOSIS — Z79899 Other long term (current) drug therapy: Secondary | ICD-10-CM | POA: Insufficient documentation

## 2019-08-02 DIAGNOSIS — Z1211 Encounter for screening for malignant neoplasm of colon: Secondary | ICD-10-CM

## 2019-08-02 DIAGNOSIS — D123 Benign neoplasm of transverse colon: Secondary | ICD-10-CM | POA: Diagnosis not present

## 2019-08-02 DIAGNOSIS — D120 Benign neoplasm of cecum: Secondary | ICD-10-CM | POA: Insufficient documentation

## 2019-08-02 DIAGNOSIS — K621 Rectal polyp: Secondary | ICD-10-CM | POA: Diagnosis not present

## 2019-08-02 DIAGNOSIS — K222 Esophageal obstruction: Secondary | ICD-10-CM | POA: Insufficient documentation

## 2019-08-02 DIAGNOSIS — Z885 Allergy status to narcotic agent status: Secondary | ICD-10-CM | POA: Diagnosis not present

## 2019-08-02 DIAGNOSIS — Z800 Family history of malignant neoplasm of digestive organs: Secondary | ICD-10-CM | POA: Insufficient documentation

## 2019-08-02 DIAGNOSIS — J45909 Unspecified asthma, uncomplicated: Secondary | ICD-10-CM | POA: Insufficient documentation

## 2019-08-02 DIAGNOSIS — R1314 Dysphagia, pharyngoesophageal phase: Secondary | ICD-10-CM

## 2019-08-02 HISTORY — PX: ESOPHAGEAL DILATION: SHX303

## 2019-08-02 HISTORY — PX: BIOPSY: SHX5522

## 2019-08-02 HISTORY — PX: COLONOSCOPY WITH PROPOFOL: SHX5780

## 2019-08-02 HISTORY — PX: ESOPHAGOGASTRODUODENOSCOPY (EGD) WITH PROPOFOL: SHX5813

## 2019-08-02 HISTORY — PX: POLYPECTOMY: SHX5525

## 2019-08-02 SURGERY — COLONOSCOPY WITH PROPOFOL
Anesthesia: General

## 2019-08-02 MED ORDER — LACTATED RINGERS IV SOLN
Freq: Once | INTRAVENOUS | Status: AC
  Administered 2019-08-02: 07:00:00 1000 mL via INTRAVENOUS

## 2019-08-02 MED ORDER — CHLORHEXIDINE GLUCONATE CLOTH 2 % EX PADS: 6.0000 | MEDICATED_PAD | Freq: Once | CUTANEOUS | Status: DC

## 2019-08-02 MED ORDER — LIDOCAINE VISCOUS HCL 2 % MT SOLN
OROMUCOSAL | Status: AC
  Filled 2019-08-02: qty 15

## 2019-08-02 MED ORDER — LIDOCAINE VISCOUS HCL 2 % MT SOLN
15.0000 mL | Freq: Once | OROMUCOSAL | Status: AC
  Administered 2019-08-02: 15 mL via OROMUCOSAL

## 2019-08-02 MED ORDER — GLYCOPYRROLATE 0.2 MG/ML IJ SOLN
0.2000 mg | Freq: Once | INTRAMUSCULAR | Status: AC
  Administered 2019-08-02: 0.2 mg via INTRAVENOUS

## 2019-08-02 MED ORDER — PROPOFOL 500 MG/50ML IV EMUL
INTRAVENOUS | Status: DC | PRN
  Administered 2019-08-02: 150 ug/kg/min via INTRAVENOUS
  Administered 2019-08-02: 100 mg via INTRAVENOUS

## 2019-08-02 MED ORDER — GLYCOPYRROLATE 0.2 MG/ML IJ SOLN
INTRAMUSCULAR | Status: AC
  Filled 2019-08-02: qty 1

## 2019-08-02 MED ORDER — PHENYLEPHRINE HCL (PRESSORS) 10 MG/ML IV SOLN
INTRAVENOUS | Status: DC | PRN
  Administered 2019-08-02: 100 ug via INTRAVENOUS
  Administered 2019-08-02: 200 ug via INTRAVENOUS

## 2019-08-02 MED ORDER — LIDOCAINE HCL (CARDIAC) PF 50 MG/5ML IV SOSY
PREFILLED_SYRINGE | INTRAVENOUS | Status: DC | PRN
  Administered 2019-08-02: 100 mg via INTRAVENOUS

## 2019-08-02 MED ORDER — LACTATED RINGERS IV SOLN: INTRAVENOUS | Status: DC | PRN

## 2019-08-02 MED ORDER — GLYCOPYRROLATE 0.2 MG/ML IJ SOLN
INTRAMUSCULAR | Status: DC | PRN
  Administered 2019-08-02: .2 mg via INTRAVENOUS

## 2019-08-02 NOTE — Op Note (Signed)
[facility] Patient Name: Seth Hartman Procedure Date: 08/02/2019 7:54 AM MRN: [id number] Date of Birth: 08-06-1965 Attending MD: Hildred Laser , MD CSN: [id number] Age: 54 Admit Type: Outpatient Procedure:                Upper GI endoscopy Indications:              Esophageal dysphagia, Gastro-esophageal reflux                            disease Providers:                Hildred Laser, MD, Rosina Lowenstein, RN, McCleary Page Referring MD:             Redmond School, MD Medicines:                Propofol per Anesthesia Complications:            No immediate complications. Estimated Blood Loss:     Estimated blood loss was minimal. Procedure:                Pre-Anesthesia Assessment:                           - Prior to the procedure, a History and Physical                            was performed, and patient medications and                            allergies were reviewed. The patient's tolerance of                            previous anesthesia was also reviewed. The risks                            and benefits of the procedure and the sedation                            options and risks were discussed with the patient.                            All questions were answered, and informed consent                            was obtained. Prior Anticoagulants: The patient has                            taken no previous anticoagulant or antiplatelet                            agents except for aspirin. ASA Grade Assessment: II                            - A patient with mild systemic disease. After  reviewing the risks and benefits, the patient was                            deemed in satisfactory condition to undergo the                            procedure.                           After obtaining informed consent, the endoscope was                            passed under direct vision. Throughout the                            procedure, the  patient's blood pressure, pulse, and                            oxygen saturations were monitored continuously. The                            GIF-H190 ([id number]) scope was introduced through the                            mouth, and advanced to the second part of duodenum.                            The upper GI endoscopy was accomplished without                            difficulty. The patient tolerated the procedure                            well. Scope In: 8:05:19 AM Scope Out: 8:15:32 AM Total Procedure Duration: 0 hours 10 minutes 13 seconds  Findings:      The hypopharynx was normal.      The examined esophagus was normal.      One benign-appearing, intrinsic mild stenosis was found at GEJ;40 cm       from the incisors. The stenosis was traversed. The scope was withdrawn.       Dilation was performed with a Maloney dilator with no resistance at 58       Fr. The dilation site was examined following endoscope reinsertion and       showed mild mucosal disruption, mild improvement in luminal narrowing       and no perforation.      Patchy moderate inflammation characterized by adherent blood, congestion       (edema), erosions, erythema and friability was found in the gastric       body, on the lesser curvature of the stomach, on the posterior wall of       the stomach and in the gastric antrum. Biopsies were taken with a cold       forceps for histology. The pathology specimen was placed into Bottle       Number 1.      The exam of the stomach was otherwise  normal.      The duodenal bulb and second portion of the duodenum were normal. Impression:               - Normal hypopharynx.                           - Normal esophagus.                           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                            Dilated.                           - Gastritis. Biopsied.                           - Normal duodenal bulb and second portion of the                             duodenum. Moderate Sedation:      Per Anesthesia Care Recommendation:           - Patient has a contact number available for                            emergencies. The signs and symptoms of potential                            delayed complications were discussed with the                            patient. Return to normal activities tomorrow.                            Written discharge instructions were provided to the                            patient.                           - Continue present medications.                           - No aspirin, ibuprofen, naproxen, or other                            non-steroidal anti-inflammatory drugs.                           - Await pathology results.                           - Repeat upper endoscopy PRN. Procedure Code(s):        --- Professional ---  87867, Esophagogastroduodenoscopy, flexible,                            transoral; with biopsy, single or multiple                           43450, Dilation of esophagus, by unguided sound or                            bougie, single or multiple passes Diagnosis Code(s):        --- Professional ---                           K22.2, Esophageal obstruction                           K29.70, Gastritis, unspecified, without bleeding                           R13.14, Dysphagia, pharyngoesophageal phase                           K21.9, Gastro-esophageal reflux disease without                            esophagitis CPT copyright 2019 American Medical Association. All rights reserved. The codes documented in this report are preliminary and upon coder review may  be revised to meet current compliance requirements. Hildred Laser, MD Hildred Laser, MD 08/02/2019 8:58:13 AM This report has been signed electronically. Number of Addenda: 0

## 2019-08-02 NOTE — Discharge Instructions (Signed)
No aspirin or NSAIDs. Resume other medications as before. Resume usual diet. No driving for 24 hours. Physicians will call with biopsy results.      Upper Endoscopy, Adult, Care After This sheet gives you information about how to care for yourself after your procedure. Your health care provider may also give you more specific instructions. If you have problems or questions, contact your health care provider. What can I expect after the procedure? After the procedure, it is common to have:  A sore throat.  Mild stomach pain or discomfort.  Bloating.  Nausea. Follow these instructions at home:   Follow instructions from your health care provider about what to eat or drink after your procedure.  Return to your normal activities as told by your health care provider. Ask your health care provider what activities are safe for you.  Take over-the-counter and prescription medicines only as told by your health care provider.  Do not drive for 24 hours if you were given a sedative during your procedure.  Keep all follow-up visits as told by your health care provider. This is important. Contact a health care provider if you have:  A sore throat that lasts longer than one day.  Trouble swallowing. Get help right away if:  You vomit blood or your vomit looks like coffee grounds.  You have: ? A fever. ? Bloody, black, or tarry stools. ? A severe sore throat or you cannot swallow. ? Difficulty breathing. ? Severe pain in your chest or abdomen. Summary  After the procedure, it is common to have a sore throat, mild stomach discomfort, bloating, and nausea.  Do not drive for 24 hours if you were given a sedative during the procedure.  Follow instructions from your health care provider about what to eat or drink after your procedure.  Return to your normal activities as told by your health care provider. This information is not intended to replace advice given to you by your  health care provider. Make sure you discuss any questions you have with your health care provider. Document Revised: 07/18/2017 Document Reviewed: 06/26/2017 Elsevier Patient Education  [place].     Colonoscopy, Adult, Care After This sheet gives you information about how to care for yourself after your procedure. Your doctor may also give you more specific instructions. If you have problems or questions, call your doctor. What can I expect after the procedure? After the procedure, it is common to have:  A small amount of blood in your poop (stool) for 24 hours.  Some gas.  Mild cramping or bloating in your belly (abdomen). Follow these instructions at home: Eating and drinking   Drink enough fluid to keep your pee (urine) pale yellow.  Follow instructions from your doctor about what you cannot eat or drink.  Return to your normal diet as told by your doctor. Avoid heavy or fried foods that are hard to digest. Activity  Rest as told by your doctor.  Do not sit for a long time without moving. Get up to take short walks every 1-2 hours. This is important. Ask for help if you feel weak or unsteady.  Return to your normal activities as told by your doctor. Ask your doctor what activities are safe for you. To help cramping and bloating:   Try walking around.  Put heat on your belly as told by your doctor. Use the heat source that your doctor recommends, such as a moist heat pack or a heating pad. ?  Put a towel between your skin and the heat source. ? Leave the heat on for 20-30 minutes. ? Remove the heat if your skin turns bright red. This is very important if you are unable to feel pain, heat, or cold. You may have a greater risk of getting burned. General instructions  For the first 24 hours after the procedure: ? Do not drive or use machinery. ? Do not sign important documents. ? Do not drink alcohol. ? Do your daily activities more slowly than normal. ? Eat  foods that are soft and easy to digest.  Take over-the-counter or prescription medicines only as told by your doctor.  Keep all follow-up visits as told by your doctor. This is important. Contact a doctor if:  You have blood in your poop 2-3 days after the procedure. Get help right away if:  You have more than a small amount of blood in your poop.  You see large clumps of tissue (blood clots) in your poop.  Your belly is swollen.  You feel like you may vomit (nauseous).  You vomit.  You have a fever.  You have belly pain that gets worse, and medicine does not help your pain. Summary  After the procedure, it is common to have a small amount of blood in your poop. You may also have mild cramping and bloating in your belly.  For the first 24 hours after the procedure, do not drive or use machinery, do not sign important documents, and do not drink alcohol.  Get help right away if you have a lot of blood in your poop, feel like you may vomit, have a fever, or have more belly pain. This information is not intended to replace advice given to you by your health care provider. Make sure you discuss any questions you have with your health care provider. Document Revised: 08/20/2018 Document Reviewed: 08/20/2018 Elsevier Patient Education  [place] After These instructions provide you with information about caring for yourself after your procedure. Your health care provider may also give you more specific instructions. Your treatment has been planned according to current medical practices, but problems sometimes occur. Call your health care provider if you have any problems or questions after your procedure. What can I expect after the procedure? After your procedure, you may: Feel sleepy for several hours. Feel clumsy and have poor balance for several hours. Feel forgetful about what happened after the procedure. Have poor judgment for  several hours. Feel nauseous or vomit. Have a sore throat if you had a breathing tube during the procedure. Follow these instructions at home: For at least 24 hours after the procedure:     Have a responsible adult stay with you. It is important to have someone help care for you until you are awake and alert. Rest as needed. Do not: Participate in activities in which you could fall or become injured. Drive. Use heavy machinery. Drink alcohol. Take sleeping pills or medicines that cause drowsiness. Make important decisions or sign legal documents. Take care of children on your own. Eating and drinking Follow the diet that is recommended by your health care provider. If you vomit, drink water, juice, or soup when you can drink without vomiting. Make sure you have little or no nausea before eating solid foods. General instructions Take over-the-counter and prescription medicines only as told by your health care provider. If you have sleep apnea, surgery and certain medicines can  increase your risk for breathing problems. Follow instructions from your health care provider about wearing your sleep device: Anytime you are sleeping, including during daytime naps. While taking prescription pain medicines, sleeping medicines, or medicines that make you drowsy. If you smoke, do not smoke without supervision. Keep all follow-up visits as told by your health care provider. This is important. Contact a health care provider if: You keep feeling nauseous or you keep vomiting. You feel light-headed. You develop a rash. You have a fever. Get help right away if: You have trouble breathing. Summary For several hours after your procedure, you may feel sleepy and have poor judgment. Have a responsible adult stay with you for at least 24 hours or until you are awake and alert. This information is not intended to replace advice given to you by your health care provider. Make sure you discuss any  questions you have with your health care provider. Document Revised: 04/24/2017 Document Reviewed: 05/17/2015 Elsevier Patient Education  [place].     Colon Polyps  Polyps are tissue growths inside the body. Polyps can grow in many places, including the large intestine (colon). A polyp may be a round bump or a mushroom-shaped growth. You could have one polyp or several. Most colon polyps are noncancerous (benign). However, some colon polyps can become cancerous over time. Finding and removing the polyps early can help prevent this. What are the causes? The exact cause of colon polyps is not known. What increases the risk? You are more likely to develop this condition if you:  Have a family history of colon cancer or colon polyps.  Are older than 47 or older than 45 if you are African American.  Have inflammatory bowel disease, such as ulcerative colitis or Crohn's disease.  Have certain hereditary conditions, such as: ? Familial adenomatous polyposis. ? Lynch syndrome. ? Turcot syndrome. ? Peutz-Jeghers syndrome.  Are overweight.  Smoke cigarettes.  Do not get enough exercise.  Drink too much alcohol.  Eat a diet that is high in fat and red meat and low in fiber.  Had childhood cancer that was treated with abdominal radiation. What are the signs or symptoms? Most polyps do not cause symptoms. If you have symptoms, they may include:  Blood coming from your rectum when having a bowel movement.  Blood in your stool. The stool may look dark red or black.  Abdominal pain.  A change in bowel habits, such as constipation or diarrhea. How is this diagnosed? This condition is diagnosed with a colonoscopy. This is a procedure in which a lighted, flexible scope is inserted into the anus and then passed into the colon to examine the area. Polyps are sometimes found when a colonoscopy is done as part of routine cancer screening tests. How is this treated? Treatment  for this condition involves removing any polyps that are found. Most polyps can be removed during a colonoscopy. Those polyps will then be tested for cancer. Additional treatment may be needed depending on the results of testing. Follow these instructions at home: Lifestyle  Maintain a healthy weight, or lose weight if recommended by your health care provider.  Exercise every day or as told by your health care provider.  Do not use any products that contain nicotine or tobacco, such as cigarettes and e-cigarettes. If you need help quitting, ask your health care provider.  If you drink alcohol, limit how much you have: ? 0-1 drink a day for women. ? 0-2 drinks a day  for men.  Be aware of how much alcohol is in your drink. In the [place] one drink equals one 12 oz bottle of beer (355 mL), one 5 oz glass of wine (148 mL), or one 1 oz shot of hard liquor (44 mL). Eating and drinking   Eat foods that are high in fiber, such as fruits, vegetables, and whole grains.  Eat foods that are high in calcium and vitamin D, such as milk, cheese, yogurt, eggs, liver, fish, and broccoli.  Limit foods that are high in fat, such as fried foods and desserts.  Limit the amount of red meat and processed meat you eat, such as hot dogs, sausage, bacon, and lunch meats. General instructions  Keep all follow-up visits as told by your health care provider. This is important. ? This includes having regularly scheduled colonoscopies. ? Talk to your health care provider about when you need a colonoscopy. Contact a health care provider if:  You have new or worsening bleeding during a bowel movement.  You have new or increased blood in your stool.  You have a change in bowel habits.  You lose weight for no known reason. Summary  Polyps are tissue growths inside the body. Polyps can grow in many places, including the colon.  Most colon polyps are noncancerous (benign), but some can become cancerous over  time.  This condition is diagnosed with a colonoscopy.  Treatment for this condition involves removing any polyps that are found. Most polyps can be removed during a colonoscopy. This information is not intended to replace advice given to you by your health care provider. Make sure you discuss any questions you have with your health care provider. Document Revised: 05/11/2017 Document Reviewed: 05/11/2017 Elsevier Patient Education  [place].

## 2019-08-02 NOTE — Op Note (Signed)
[facility] Patient Name: Seth Hartman Procedure Date: 08/02/2019 7:52 AM MRN: [id number] Date of Birth: 1965/10/31 Attending MD: Hildred Laser , MD CSN: [id number] Age: 54 Admit Type: Outpatient Procedure:                Colonoscopy Indications:              Screening for colorectal malignant neoplasm Providers:                Hildred Laser, MD, Rosina Lowenstein, RN, Tornado Page Referring MD:             Redmond [employer], MD Medicines:                Propofol per Anesthesia Complications:            No immediate complications. Estimated Blood Loss:     Estimated blood loss was minimal. Procedure:                Pre-Anesthesia Assessment:                           - Prior to the procedure, a History and Physical                            was performed, and patient medications and                            allergies were reviewed. The patient's tolerance of                            previous anesthesia was also reviewed. The risks                            and benefits of the procedure and the sedation                            options and risks were discussed with the patient.                            All questions were answered, and informed consent                            was obtained. Prior Anticoagulants: The patient has                            taken no previous anticoagulant or antiplatelet                            agents except for aspirin. ASA Grade Assessment: II                            - A patient with mild systemic disease. After                            reviewing the risks and benefits, the patient was  deemed in satisfactory condition to undergo the                            procedure.                           After obtaining informed consent, the colonoscope                            was passed under direct vision. Throughout the                            procedure, the patient's blood pressure, pulse, and                             oxygen saturations were monitored continuously. The                            [id number]-[id number] ([id number]) scope was introduced through                            the anus and advanced to the the cecum, identified                            by appendiceal orifice and ileocecal valve. The                            colonoscopy was performed without difficulty. The                            patient tolerated the procedure well. The quality                            of the bowel preparation was adequate to identify                            polyps. The ileocecal valve, appendiceal orifice,                            and rectum were photographed. Scope In: 8:19:21 AM Scope Out: 8:48:39 AM Scope Withdrawal Time: 0 hours 24 minutes 57 seconds  Total Procedure Duration: 0 hours 29 minutes 18 seconds  Findings:      The perianal and digital rectal examinations were normal.      A 10 mm polyp was found in the cecum. The polyp was sessile. Area was       successfully injected with 3 mL Eleview for lesion assessment, and this       injection appeared to lift the lesion adequately. The polyp was removed       with a hot snare. Resection and retrieval were complete. The pathology       specimen was placed into Bottle Number 2.      A 6 mm polyp was found in the splenic flexure. The polyp was removed       with a cold snare. Resection and retrieval were complete. The pathology  specimen was placed into Bottle Number 3.      Six polyps were found in the rectum, recto-sigmoid colon and sigmoid       colon. The polyps were 4 to 6 mm in size. These polyps were removed with       a hot snare. Resection and retrieval were complete. The pathology       specimen was placed into Bottle Number 3.      The exam was otherwise normal throughout the examined colon.      External hemorrhoids were found during retroflexion. The hemorrhoids       were small.      Anal papilla(e) were  hypertrophied. Impression:               - One 10 mm polyp in the cecum, removed with a hot                            snare. Resected and retrieved. Injected.                           - One 6 mm polyp at the splenic flexure, removed                            with a cold snare. Resected and retrieved.                           - Six 4 to 6 mm polyps in the rectum, at the                            recto-sigmoid colon and in the sigmoid colon,                            removed with a hot snare. Resected and retrieved.                           - External hemorrhoids.                           - Anal papilla(e) were hypertrophied. Moderate Sedation:      Per Anesthesia Care Recommendation:           - Patient has a contact number available for                            emergencies. The signs and symptoms of potential                            delayed complications were discussed with the                            patient. Return to normal activities tomorrow.                            Written discharge instructions were provided to the  patient.                           - Resume previous diet today.                           - Continue present medications.                           - No aspirin, ibuprofen, naproxen, or other                            non-steroidal anti-inflammatory drugs after polyp                            removal.                           - Await pathology results.                           - Repeat colonoscopy is recommended. The                            colonoscopy date will be determined after pathology                            results from today's exam become available for                            review. Procedure Code(s):        --- Professional ---                           [id number], Colonoscopy, flexible; with removal of                            tumor(s), polyp(s), or other lesion(s) by snare                             technique                           45381, Colonoscopy, flexible; with directed                            submucosal injection(s), any substance Diagnosis Code(s):        --- Professional ---                           Z12.11, Encounter for screening for malignant                            neoplasm of colon                           K63.5, Polyp of colon  K62.1, Rectal polyp                           K62.89, Other specified diseases of anus and rectum                           K64.4, Residual hemorrhoidal skin tags CPT copyright 2019 American Medical Association. All rights reserved. The codes documented in this report are preliminary and upon coder review may  be revised to meet current compliance requirements. Hildred Laser, MD Hildred Laser, MD 08/02/2019 9:05:18 AM This report has been signed electronically. Number of Addenda: 0

## 2019-08-02 NOTE — Transfer of Care (Signed)
Immediate Anesthesia Transfer of Care Note  Patient: Seth Hartman  Procedure(s) Performed: COLONOSCOPY WITH PROPOFOL (N/A ) ESOPHAGOGASTRODUODENOSCOPY (EGD) WITH PROPOFOL (N/A ) ESOPHAGEAL DILATION (N/A ) BIOPSY POLYPECTOMY  Patient Location: PACU  Anesthesia Type:MAC  Level of Consciousness: awake, alert , oriented and patient cooperative  Airway & Oxygen Therapy: Patient Spontanous Breathing and Patient connected to face mask oxygen  Post-op Assessment: Report given to RN, Post -op Vital signs reviewed and stable and Patient moving all extremities  Post vital signs: Reviewed and stable  Last Vitals:  Vitals Value Taken Time  BP    Temp    Pulse 92 08/02/19 0855  Resp 21 08/02/19 0855  SpO2 93 % 08/02/19 0855  Vitals shown include unvalidated device data.  Last Pain:  Vitals:   08/02/19 0759  TempSrc:   PainSc: 0-No pain      Patients Stated Pain Goal: 8 (97/74/14 2395)  Complications: No complications documented.

## 2019-08-02 NOTE — Anesthesia Preprocedure Evaluation (Signed)
Anesthesia Evaluation  Patient identified by MRN, date of birth, ID band Patient awake    Reviewed: Allergy & Precautions, NPO status , Patient's Chart, lab work & pertinent test results  History of Anesthesia Complications (+) PONV and history of anesthetic complications  Airway Mallampati: III  TM Distance: >3 FB Neck ROM: Full    Dental  (+) Missing, Dental Advisory Given   Pulmonary asthma , neg sleep apnea (snoring),    Pulmonary exam normal breath sounds clear to auscultation       Cardiovascular Exercise Tolerance: Good negative cardio ROS Normal cardiovascular exam Rhythm:Regular Rate:Normal     Neuro/Psych negative neurological ROS  negative psych ROS   GI/Hepatic Neg liver ROS, GERD  Medicated and Controlled,  Endo/Other  negative endocrine ROS  Renal/GU negative Renal ROS  negative genitourinary   Musculoskeletal negative musculoskeletal ROS (+)   Abdominal   Peds negative pediatric ROS (+)  Hematology negative hematology ROS (+)   Anesthesia Other Findings   Reproductive/Obstetrics negative OB ROS                            Anesthesia Physical Anesthesia Plan  ASA: II  Anesthesia Plan: General   Post-op Pain Management:    Induction: Intravenous  PONV Risk Score and Plan: 2 and TIVA  Airway Management Planned: Nasal Cannula, Natural Airway and Simple Face Mask  Additional Equipment:   Intra-op Plan:   Post-operative Plan:   Informed Consent: I have reviewed the patients History and Physical, chart, labs and discussed the procedure including the risks, benefits and alternatives for the proposed anesthesia with the patient or authorized representative who has indicated his/her understanding and acceptance.     Dental advisory given  Plan Discussed with: CRNA and Surgeon  Anesthesia Plan Comments:         Anesthesia Quick Evaluation

## 2019-08-02 NOTE — H&P (Signed)
Seth Hartman is an 54 y.o. male.   Chief Complaint: Patient is here for esophagogastroduodenoscopy with esophageal dilation. HPI: Patient is 54 year old Caucasian male who has chronic GERD maintained on PPI but satisfactory control of symptoms who presents with 1 year history of intermittent dysphagia to solids.  His last dilation was in October 2015.  No structural abnormality was noted but he did respond to dilation.  Has most difficulty with meats and may have an episode of food impaction relieved with regurgitation once a month.  He denies throat symptoms. He is also undergoing screening colonoscopy.  He denies change in bowel habits or rectal bleeding.  Grandmother had colon carcinoma in her 29s.  Past Medical History:  Diagnosis Date  . Asthma   . Asthma   . GERD (gastroesophageal reflux disease)   . PONV (postoperative nausea and vomiting)     Past Surgical History:  Procedure Laterality Date  . ESOPHAGOGASTRODUODENOSCOPY N/A 11/22/2013   Procedure: ESOPHAGOGASTRODUODENOSCOPY (EGD);  Surgeon: Rogene Houston, MD;  [facility];  Service: Endoscopy;  Laterality: N/A;  1155  . MALONEY DILATION N/A 11/22/2013   Procedure: Venia Minks DILATION;  Surgeon: Rogene Houston, MD;  [facility];  Service: Endoscopy;  Laterality: N/A;  . pvc's    . SINUS SURGERY WITH INSTATRAK      History reviewed. No pertinent family history. Social History:  reports that he has never smoked. He has never used smokeless tobacco. He reports that he does not drink alcohol and does not use drugs.  Allergies:  Allergies  Allergen Reactions  . Penicillins Shortness Of Breath    REACTION: Higher dose resulted in breathing difficulty  . Bee Venom Swelling  . Codeine Nausea And Vomiting, Nausea Only and Other (See Comments)    REACTION: dizziness    Medications Prior to Admission  Medication Sig Dispense Refill  . albuterol (PROVENTIL HFA;VENTOLIN HFA) 108 (90 BASE) MCG/ACT inhaler  Inhale 2 puffs into the lungs every 6 (six) hours as needed. For shortness of breath    . Ascorbic Acid (VITAMIN C WITH ROSE HIPS) 500 MG tablet Take 500 mg by mouth daily.    [name] aspirin EC 81 MG tablet Take 81 mg by mouth 3 (three) times a week.    . cholecalciferol (VITAMIN D) 25 MCG (1000 UNIT) tablet Take 1,000 Units by mouth daily.    . pantoprazole (PROTONIX) 40 MG tablet Take 1 tablet (40 mg total) by mouth daily before breakfast. 90 tablet 3  . SUTAB [id number] MG TABS Take by mouth as directed.      Results for orders placed or performed during the hospital encounter of 07/31/19 (from the past 48 hour(s))  SARS CORONAVIRUS 2 (TAT 6-24 HRS) Nasopharyngeal Nasopharyngeal Swab     Status: None   Collection Time: 07/31/19  2:27 PM   Specimen: Nasopharyngeal Swab  Result Value Ref Range   SARS Coronavirus 2 NEGATIVE NEGATIVE    Comment: (NOTE) SARS-CoV-2 target nucleic acids are NOT DETECTED.  The SARS-CoV-2 RNA is generally detectable in upper and lower respiratory specimens during the acute phase of infection. Negative results do not preclude SARS-CoV-2 infection, do not rule out co-infections with other pathogens, and should not be used as the sole basis for treatment or other patient management decisions. Negative results must be combined with clinical observations, patient history, and epidemiological information. The expected result is Negative.  Fact Sheet for Patients: SugarRoll.be  Fact Sheet for Healthcare Providers: https://www.woods-mathews.com/  This test  is not yet approved or cleared by the [place] and  has been authorized for detection and/or diagnosis of SARS-CoV-2 by FDA under an Emergency Use Authorization (EUA). This EUA will remain  in effect (meaning this test can be used) for the duration of the COVID-19 declaration under Se ction 564(b)(1) of the Act, 21 U.S.C. section 360bbb-3(b)(1), unless the  authorization is terminated or revoked sooner.  Performed at [facility], [place] [address] [place], [place] [address]    No results found.  Review of Systems  Blood pressure (!) 143/89, temperature 98.5 F (36.9 C), temperature source Oral, resp. rate 19, height 5\' 6"  (1.676 m), weight 88 kg, SpO2 96 %. Physical Exam  HENT:  Mouth/Throat: Mucous membranes are moist. Oropharynx is clear.  Eyes: Conjunctivae are normal. No scleral icterus.  Cardiovascular: Normal rate, regular rhythm and normal heart sounds.  No murmur heard. Respiratory: Effort normal and breath sounds normal.  GI:  Abdomen is full but soft and nontender with organomegaly or masses.  Musculoskeletal:        General: No swelling.     Cervical back: Neck supple.  Lymphadenopathy:    He has no cervical adenopathy.  Neurological: He is alert.  Skin: Skin is warm and dry.  Psychiatric: His behavior is normal.     Assessment/Plan Esophageal dysphagia in patient with chronic GERD. Esophagogastroduodenoscopy with esophageal dilation and average risk screening colonoscopy.  Hildred Laser, MD 08/02/2019, 7:56 AM

## 2019-08-02 NOTE — Anesthesia Postprocedure Evaluation (Signed)
Anesthesia Post Note  Patient: Seth Hartman  Procedure(s) Performed: COLONOSCOPY WITH PROPOFOL (N/A ) ESOPHAGOGASTRODUODENOSCOPY (EGD) WITH PROPOFOL (N/A ) ESOPHAGEAL DILATION (N/A ) BIOPSY POLYPECTOMY  Patient location during evaluation: PACU Anesthesia Type: General Level of consciousness: awake, oriented, awake and alert and patient cooperative Pain management: pain level controlled Vital Signs Assessment: post-procedure vital signs reviewed and stable Respiratory status: spontaneous breathing, respiratory function stable, nonlabored ventilation and patient connected to face mask oxygen Cardiovascular status: blood pressure returned to baseline and stable Postop Assessment: no headache and no backache Anesthetic complications: no   No complications documented.   Last Vitals:  Vitals:   08/02/19 0717  BP: (!) 143/89  Resp: 19  Temp: 36.9 C  SpO2: 96%    Last Pain:  Vitals:   08/02/19 0759  TempSrc:   PainSc: 0-No pain                 Tacy Learn

## 2019-08-03 ENCOUNTER — Encounter (HOSPITAL_COMMUNITY): Payer: Self-pay | Admitting: *Deleted

## 2019-08-03 ENCOUNTER — Other Ambulatory Visit: Payer: Self-pay

## 2019-08-03 ENCOUNTER — Emergency Department (HOSPITAL_COMMUNITY)
Admission: EM | Admit: 2019-08-03 | Discharge: 2019-08-03 | Disposition: A | Discharge status: Home / Self Care | Payer: Managed Care, Other (non HMO) | Attending: Emergency Medicine | Admitting: Emergency Medicine

## 2019-08-03 ENCOUNTER — Emergency Department (HOSPITAL_COMMUNITY): Payer: Managed Care, Other (non HMO)

## 2019-08-03 DIAGNOSIS — M79604 Pain in right leg: Secondary | ICD-10-CM | POA: Insufficient documentation

## 2019-08-03 DIAGNOSIS — Z86010 Personal history of colonic polyps: Secondary | ICD-10-CM | POA: Diagnosis not present

## 2019-08-03 DIAGNOSIS — R17 Unspecified jaundice: Secondary | ICD-10-CM

## 2019-08-03 DIAGNOSIS — M25511 Pain in right shoulder: Secondary | ICD-10-CM | POA: Insufficient documentation

## 2019-08-03 DIAGNOSIS — Z9889 Other specified postprocedural states: Secondary | ICD-10-CM

## 2019-08-03 LAB — CBC WITH DIFFERENTIAL/PLATELET
Abs Immature Granulocytes: 0.04 10*3/uL (ref 0.00–0.07)
Basophils Absolute: 0 10*3/uL (ref 0.0–0.1)
Basophils Relative: 0 %
Eosinophils Absolute: 0.1 10*3/uL (ref 0.0–0.5)
Eosinophils Relative: 0 %
HCT: 40.8 % (ref 39.0–52.0)
Hemoglobin: 13.8 g/dL (ref 13.0–17.0)
Immature Granulocytes: 0 %
Lymphocytes Relative: 14 %
Lymphs Abs: 1.6 10*3/uL (ref 0.7–4.0)
MCH: 31.4 pg (ref 26.0–34.0)
MCHC: 33.8 g/dL (ref 30.0–36.0)
MCV: 92.7 fL (ref 80.0–100.0)
Monocytes Absolute: 1.3 10*3/uL — ABNORMAL HIGH (ref 0.1–1.0)
Monocytes Relative: 11 %
Neutro Abs: 8.4 10*3/uL — ABNORMAL HIGH (ref 1.7–7.7)
Neutrophils Relative %: 75 %
Platelets: 182 10*3/uL (ref 150–400)
RBC: 4.4 MIL/uL (ref 4.22–5.81)
RDW: 12.5 % (ref 11.5–15.5)
WBC: 11.4 10*3/uL — ABNORMAL HIGH (ref 4.0–10.5)
nRBC: 0 % (ref 0.0–0.2)

## 2019-08-03 LAB — COMPREHENSIVE METABOLIC PANEL
ALT: 45 U/L — ABNORMAL HIGH (ref 0–44)
AST: 24 U/L (ref 15–41)
Albumin: 4.1 g/dL (ref 3.5–5.0)
Alkaline Phosphatase: 80 U/L (ref 38–126)
Anion gap: 9 (ref 5–15)
BUN: 13 mg/dL (ref 6–20)
CO2: 24 mmol/L (ref 22–32)
Calcium: 8.7 mg/dL — ABNORMAL LOW (ref 8.9–10.3)
Chloride: 104 mmol/L (ref 98–111)
Creatinine, Ser: 0.8 mg/dL (ref 0.61–1.24)
GFR calc Af Amer: >60 mL/min (ref >60)
GFR calc non Af Amer: >60 mL/min (ref >60)
Glucose, Bld: 177 mg/dL — ABNORMAL HIGH (ref 70–99)
Potassium: 3.6 mmol/L (ref 3.5–5.1)
Sodium: 137 mmol/L (ref 135–145)
Total Bilirubin: 2.4 mg/dL — ABNORMAL HIGH (ref 0.3–1.2)
Total Protein: 6.9 g/dL (ref 6.5–8.1)

## 2019-08-03 LAB — LACTIC ACID, PLASMA
Lactic Acid, Venous: 1.2 mmol/L (ref 0.5–1.9)
Lactic Acid, Venous: 0.9 mmol/L (ref 0.5–1.9)

## 2019-08-03 LAB — LIPASE, BLOOD: Lipase: 34 U/L (ref 11–51)

## 2019-08-03 LAB — CK: Total CK: 106 U/L (ref 49–397)

## 2019-08-03 MED ORDER — ONDANSETRON HCL 4 MG/2ML IJ SOLN
4.0000 mg | Freq: Once | INTRAMUSCULAR | Status: AC
  Administered 2019-08-03: 4 mg via INTRAVENOUS
  Filled 2019-08-03: qty 2

## 2019-08-03 MED ORDER — IOHEXOL 300 MG/ML  SOLN
100.0000 mL | Freq: Once | INTRAMUSCULAR | Status: AC | PRN
  Administered 2019-08-03: 100 mL via INTRAVENOUS

## 2019-08-03 MED ORDER — MORPHINE SULFATE (PF) 4 MG/ML IV SOLN
4.0000 mg | Freq: Once | INTRAVENOUS | Status: AC
  Administered 2019-08-03: 4 mg via INTRAVENOUS
  Filled 2019-08-03: qty 1

## 2019-08-03 MED ORDER — HYDROCODONE-ACETAMINOPHEN 5-325 MG PO TABS: 1.0000 | ORAL_TABLET | ORAL | 0 refills | Status: DC | PRN

## 2019-08-03 MED ORDER — SODIUM CHLORIDE 0.9 % IV BOLUS
1000.0000 mL | Freq: Once | INTRAVENOUS | Status: AC
  Administered 2019-08-03: 1000 mL via INTRAVENOUS

## 2019-08-03 NOTE — ED Triage Notes (Signed)
Pt states he had a EGD and colonoscopy yesterday and today he woke up with right leg and arm pain

## 2019-08-03 NOTE — Discharge Instructions (Signed)
Low fiber diet for 24 hrs.

## 2019-08-03 NOTE — ED Provider Notes (Signed)
[facility] EMERGENCY DEPARTMENT Provider Note   CSN: [id number] Arrival date & time: 08/03/19  0744     History Chief Complaint  Patient presents with  . Leg Pain    Seth Hartman is a 54 y.o. male.  Pt presents to the ED today with right shoulder and right thigh pain.  He said he had an EGD and a colonoscopy yesterday.  He has several polyps removed and his esophagus stretched.  During the procedure, he was on his left side (per pt).  Procedures totaled around 40 minutes.  Procedures were done around 0800, but he did not go home from recovery until the afternoon because he takes awhile to wake up.  Pt noticed his leg and shoulder hurting while he was in recovery.  It continued to hurt at home and he had a hard time sleeping.  He took some tylenol at 0300.  He said it hurts to walk.        Past Medical History:  Diagnosis Date  . Asthma   . Asthma   . GERD (gastroesophageal reflux disease)   . PONV (postoperative nausea and vomiting)     Patient Active Problem List   Diagnosis Date Noted  . GERD (gastroesophageal reflux disease) 06/24/2019  . 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 Esophageal dysphagia 11/15/2013  . Asthma, chronic 11/15/2013    Past Surgical History:  Procedure Laterality Date  . ESOPHAGOGASTRODUODENOSCOPY N/A 11/22/2013   Procedure: ESOPHAGOGASTRODUODENOSCOPY (EGD);  Surgeon: Rogene Houston, MD;  [facility];  Service: Endoscopy;  Laterality: N/A;  1155  . MALONEY DILATION N/A 11/22/2013   Procedure: Venia Minks DILATION;  Surgeon: Rogene Houston, MD;  [facility];  Service: Endoscopy;  Laterality: N/A;  . pvc's    . SINUS SURGERY WITH INSTATRAK         History reviewed. No pertinent family history.  Social History   Tobacco Use  . Smoking status: Never Smoker  . Smokeless tobacco: Never Used  Vaping Use  . Vaping Use: Never used  Substance Use Topics  . Alcohol use: No  . Drug use: No    Home Medications Prior to  Admission medications   Medication Sig Start Date End Date Taking? Authorizing Provider  Ascorbic Acid (VITAMIN C WITH ROSE HIPS) 500 MG tablet Take 500 mg by mouth daily.   Yes [provider]  cholecalciferol (VITAMIN D) 25 MCG (1000 UNIT) tablet Take 1,000 Units by mouth daily.   Yes [provider]  pantoprazole (PROTONIX) 40 MG tablet Take 1 tablet (40 mg total) by mouth daily before breakfast. 06/24/19  Yes Rehman, Mechele Dawley, MD  albuterol (PROVENTIL HFA;VENTOLIN HFA) 108 (90 BASE) MCG/ACT inhaler Inhale 2 puffs into the lungs every 6 (six) hours as needed. For shortness of breath Patient not taking: Reported on 08/03/2019    [provider]  HYDROcodone-acetaminophen (NORCO/VICODIN) 5-325 MG tablet Take 1 tablet by mouth every 4 (four) hours as needed. 08/03/19   Isla Pence, MD    Allergies    Penicillins, Bee venom, and Codeine  Review of Systems   Review of Systems  Musculoskeletal:       Right shoulder and right thigh pain  All other systems reviewed and are negative.   Physical Exam Updated Vital Signs BP (!) 147/85 (BP Location: Left Arm)   Pulse 91   Temp 98.2 F (36.8 C) (Oral)   Resp 20   Ht 5\' 6"  (1.676 m)   Wt 88 kg  SpO2 95%   BMI 31.31 kg/m   Physical Exam Vitals and nursing note reviewed.  Constitutional:      Appearance: Normal appearance.  HENT:     Head: Normocephalic and atraumatic.     Right Ear: External ear normal.     Left Ear: External ear normal.     Nose: Nose normal.     Mouth/Throat:     Mouth: Mucous membranes are moist.     Pharynx: Oropharynx is clear.  Eyes:     Extraocular Movements: Extraocular movements intact.     Conjunctiva/sclera: Conjunctivae normal.     Pupils: Pupils are equal, round, and reactive to light.  Cardiovascular:     Rate and Rhythm: Normal rate and regular rhythm.     Pulses: Normal pulses.     Heart sounds: Normal heart sounds.  Pulmonary:     Effort: Pulmonary effort is  normal.     Breath sounds: Normal breath sounds.  Abdominal:     General: Abdomen is flat. Bowel sounds are normal.     Palpations: Abdomen is soft.  Musculoskeletal:     Cervical back: Normal range of motion and neck supple.     Comments: Right shoulder pain with movement.  No deformity seen. Right thigh pain with leg elevation and movement.  No deformity.  Skin:    General: Skin is warm.     Capillary Refill: Capillary refill takes less than 2 seconds.  Neurological:     Mental Status: He is alert.     ED Results / Procedures / Treatments   Labs (all labs ordered are listed, but only abnormal results are displayed) Labs Reviewed  COMPREHENSIVE METABOLIC PANEL - Abnormal; Notable for the following components:      Result Value   Glucose, Bld 177 (*)    Calcium 8.7 (*)    ALT 45 (*)    Total Bilirubin 2.4 (*)    All other components within normal limits  CBC WITH DIFFERENTIAL/PLATELET - Abnormal; Notable for the following components:   WBC 11.4 (*)    Neutro Abs 8.4 (*)    Monocytes Absolute 1.3 (*)    All other components within normal limits  LACTIC ACID, PLASMA  LACTIC ACID, PLASMA  CK  LIPASE, BLOOD    EKG None  Radiology CT ABDOMEN PELVIS W CONTRAST  Result Date: 08/03/2019 CLINICAL DATA:  Pt states he had a EGD and colonoscopy yesterday and today he woke up with right leg and arm pain. Pt stated EGD was for difficulty swallowing and colonoscopy was routine. Hx of GERD. EXAM: CT ABDOMEN AND PELVIS WITH CONTRAST TECHNIQUE: Multidetector CT imaging of the abdomen and pelvis was performed using the standard protocol following bolus administration of intravenous contrast. CONTRAST:  137mL OMNIPAQUE IOHEXOL 300 MG/ML  SOLN COMPARISON:  12/22/2013 FINDINGS: Lower chest: Mild linear atelectasis versus scarring at the anterior lung bases. Heart normal in size. Hepatobiliary: Liver normal in size. Diffuse decreased attenuation of the liver consistent with fatty infiltration.  Subcentimeter low-density lesion at the dome of the lateral segment of the left lobe, likely a cyst. No other liver masses or lesions. Normal gallbladder. No bile duct dilation. Pancreas: Unremarkable. No pancreatic ductal dilatation or surrounding inflammatory changes. Spleen: Normal in size without focal abnormality. Adrenals/Urinary Tract: Normal adrenal glands. Kidneys normal in size, orientation and position with symmetric enhancement and excretion. 1 cm low-density lesion consistent with a cyst, upper pole the left kidney. Subcentimeter lesion arising from the upper pole the  right kidney with another from the posterior midpole of the left kidney, too small to characterize but also likely cysts. No renal stones. No hydronephrosis. Normal ureters. Normal bladder. Stomach/Bowel: Stomach is within normal limits. Appendix appears normal. No evidence of bowel wall thickening, distention, or inflammatory changes. Vascular/Lymphatic: Mildly enlarged posterior gastrohepatic ligament lymph node, 1.2 cm short axis, unchanged. No other enlarged lymph nodes. No vascular abnormality. Reproductive: Unremarkable. Other: No abdominal wall hernia or abnormality. No abdominopelvic ascites. Musculoskeletal: No fracture or acute finding. No osteoblastic or osteolytic lesions. IMPRESSION: 1. No acute findings within the abdomen or pelvis. No evidence of a complication from endoscopy or colonoscopy. 2. Hepatic steatosis. Electronically Signed   By: Lajean Manes M.D.   On: 08/03/2019 10:07    Procedures Procedures (including critical care time)  Medications Ordered in ED Medications  sodium chloride 0.9 % bolus 1,000 mL (1,000 mLs Intravenous New Bag/Given 08/03/19 0849)  morphine 4 MG/ML injection 4 mg (4 mg Intravenous Given 08/03/19 0851)  ondansetron (ZOFRAN) injection 4 mg (4 mg Intravenous Given 08/03/19 0850)  iohexol (OMNIPAQUE) 300 MG/ML solution 100 mL (100 mLs Intravenous Contrast Given 08/03/19 0940)    ED  Course  I have reviewed the triage vital signs and the nursing notes.  Pertinent labs & imaging results that were available during my care of the patient were reviewed by me and considered in my medical decision making (see chart for details).    MDM Rules/Calculators/A&P                          No evidence of rhabdo.  Likely strain from positioning during procedure.  Doubt DVT as there is no calf pain or swelling.  Bilirubin is slightly elevated, but other lfts are ok.  CT neg.  Pt d/w Dr. Laural Golden.  He recommends some pain meds with a low residue diet. Pt to return if worse.    Final Clinical Impression(s) / ED Diagnoses Final diagnoses:  Right leg pain  Acute pain of right shoulder  High bilirubin  S/P colonoscopy with polypectomy    Rx / DC Orders ED Discharge Orders         Ordered    HYDROcodone-acetaminophen (NORCO/VICODIN) 5-325 MG tablet  Every 4 hours PRN     Discontinue  Reprint     08/03/19 1112           Isla Pence, MD 08/03/19 1116

## 2019-08-05 ENCOUNTER — Other Ambulatory Visit (INDEPENDENT_AMBULATORY_CARE_PROVIDER_SITE_OTHER): Payer: Self-pay | Admitting: Internal Medicine

## 2019-08-05 ENCOUNTER — Other Ambulatory Visit: Payer: Self-pay

## 2019-08-05 LAB — SURGICAL PATHOLOGY

## 2019-08-05 MED ORDER — ESOMEPRAZOLE MAGNESIUM 40 MG PO CPDR: 40.0000 mg | DELAYED_RELEASE_CAPSULE | Freq: Every day | ORAL | 5 refills | Status: DC

## 2019-08-05 NOTE — Progress Notes (Signed)
Called to follow-up with patient from procedure done on 08/02/2019 with Dr.Rehman. Patient stated he was seen in [facility] ED on Saturday 08/03/2019 for right leg and right shoulder pain. Patient stated the ED believes the pain was related to his position on the table. Patient stated the pain has since resolved and he has no further questions or concerns.

## 2019-08-06 ENCOUNTER — Encounter (HOSPITAL_COMMUNITY): Payer: Self-pay | Admitting: Internal Medicine

## 2019-08-07 ENCOUNTER — Other Ambulatory Visit: Payer: Self-pay

## 2019-08-07 ENCOUNTER — Encounter: Payer: Self-pay | Admitting: Emergency Medicine

## 2019-08-07 ENCOUNTER — Ambulatory Visit
Admission: EM | Admit: 2019-08-07 | Discharge: 2019-08-07 | Disposition: A | Discharge status: Home / Self Care | Payer: Managed Care, Other (non HMO) | Attending: Emergency Medicine | Admitting: Emergency Medicine

## 2019-08-07 DIAGNOSIS — J069 Acute upper respiratory infection, unspecified: Secondary | ICD-10-CM

## 2019-08-07 DIAGNOSIS — J209 Acute bronchitis, unspecified: Secondary | ICD-10-CM

## 2019-08-07 MED ORDER — BENZONATATE 100 MG PO CAPS
100.0000 mg | ORAL_CAPSULE | Freq: Three times a day (TID) | ORAL | 0 refills | Status: DC
Start: 2019-08-07 — End: 2019-08-09

## 2019-08-07 MED ORDER — CETIRIZINE HCL 10 MG PO TABS
10.0000 mg | ORAL_TABLET | Freq: Every day | ORAL | 0 refills | Status: DC
Start: 2019-08-07 — End: 2022-05-09

## 2019-08-07 MED ORDER — PREDNISONE 20 MG PO TABS: 20.0000 mg | ORAL_TABLET | Freq: Two times a day (BID) | ORAL | 0 refills | Status: AC

## 2019-08-07 MED ORDER — FLUTICASONE PROPIONATE 50 MCG/ACT NA SUSP
2.0000 | Freq: Every day | NASAL | 0 refills | Status: DC
Start: 2019-08-07 — End: 2021-03-25

## 2019-08-07 NOTE — ED Provider Notes (Signed)
[facility]   [id number] 08/07/19 Arrival Time: 0807   CC: Cough  SUBJECTIVE: History from: patient.  AYAD NIEMAN is a 54 y.o. male hx significant for asthma and GERD, who presents with runny nose, congestion, and dry cough x 2 days.  Girlfriend recently diagnosed with brochitis.  Has tried OTC medications without relief.  Symptoms are made worse with coughing fits.  Reports previous symptoms in the past.   Also mentions wheezing with coughing fits.  Denies fever, chills, fatigue, sore throat, SOB, chest pain, nausea, changes in bowel or bladder habits.    ROS: As per HPI.  All other pertinent ROS negative.     Past Medical History:  Diagnosis Date  . Asthma   . Asthma   . GERD (gastroesophageal reflux disease)   . PONV (postoperative nausea and vomiting)    Past Surgical History:  Procedure Laterality Date  . BIOPSY  08/02/2019   Procedure: BIOPSY;  Surgeon: Rogene Houston, MD;  [facility];  Service: Endoscopy;;  gastric biopsy  . COLONOSCOPY WITH PROPOFOL N/A 08/02/2019   Procedure: COLONOSCOPY WITH PROPOFOL;  Surgeon: Rogene Houston, MD;  [facility];  Service: Endoscopy;  Laterality: N/A;  830  . ESOPHAGEAL DILATION N/A 08/02/2019   Procedure: ESOPHAGEAL DILATION;  Surgeon: Rogene Houston, MD;  [facility];  Service: Endoscopy;  Laterality: N/A;  . ESOPHAGOGASTRODUODENOSCOPY N/A 11/22/2013   Procedure: ESOPHAGOGASTRODUODENOSCOPY (EGD);  Surgeon: Rogene Houston, MD;  [facility];  Service: Endoscopy;  Laterality: N/A;  1155  . ESOPHAGOGASTRODUODENOSCOPY (EGD) WITH PROPOFOL N/A 08/02/2019   Procedure: ESOPHAGOGASTRODUODENOSCOPY (EGD) WITH PROPOFOL;  Surgeon: Rogene Houston, MD;  [facility];  Service: Endoscopy;  Laterality: N/A;  . MALONEY DILATION N/A 11/22/2013   Procedure: Venia Minks DILATION;  Surgeon: Rogene Houston, MD;  [facility];  Service: Endoscopy;  Laterality: N/A;  .  POLYPECTOMY  08/02/2019   Procedure: POLYPECTOMY;  Surgeon: Rogene Houston, MD;  [facility];  Service: Endoscopy;;  cecal polyp, splenic flexure polyp, sigmoid polyp x2, sigmoid rectal polyp x2, rectal polyp  . pvc's    . SINUS SURGERY WITH INSTATRAK     Allergies  Allergen Reactions  . Penicillins Shortness Of Breath    REACTION: Higher dose resulted in breathing difficulty  . Bee Venom Swelling  . Codeine Nausea And Vomiting, Nausea Only and Other (See Comments)    REACTION: dizziness   No current facility-administered medications on file prior to encounter.   Current Outpatient Medications on File Prior to Encounter  Medication Sig Dispense Refill  . Ascorbic Acid (VITAMIN C WITH ROSE HIPS) 500 MG tablet Take 500 mg by mouth daily.    . cholecalciferol (VITAMIN D) 25 MCG (1000 UNIT) tablet Take 1,000 Units by mouth daily.    [name] esomeprazole (NEXIUM) 40 MG capsule Take 1 capsule (40 mg total) by mouth daily before breakfast. 30 capsule 5  . HYDROcodone-acetaminophen (NORCO/VICODIN) 5-325 MG tablet Take 1 tablet by mouth every 4 (four) hours as needed. 10 tablet 0  . [DISCONTINUED] albuterol (PROVENTIL HFA;VENTOLIN HFA) 108 (90 BASE) MCG/ACT inhaler Inhale 2 puffs into the lungs every 6 (six) hours as needed. For shortness of breath (Patient not taking: Reported on 08/03/2019)     Social History   Socioeconomic History  . Marital status: Divorced    Spouse name: Not on file  . Number of children: Not on file  . Years of  education: Not on file  . Highest education level: Not on file  Occupational History  . Not on file  Tobacco Use  . Smoking status: Never Smoker  . Smokeless tobacco: Never Used  Vaping Use  . Vaping Use: Never used  Substance and Sexual Activity  . Alcohol use: No  . Drug use: No  . Sexual activity: Not Currently  Other Topics Concern  . Not on file  Social History Narrative  . Not on file   Social Determinants of Health   Financial  Resource Strain:   . Difficulty of Paying Living Expenses:   Food Insecurity:   . Worried About Charity fundraiser in the Last Year:   . Arboriculturist in the Last Year:   Transportation Needs:   . Film/video editor (Medical):   [name] Lack of Transportation (Non-Medical):   Physical Activity:   . Days of Exercise per Week:   . Minutes of Exercise per Session:   Stress:   . Feeling of Stress :   Social Connections:   . Frequency of Communication with Friends and Family:   . Frequency of Social Gatherings with Friends and Family:   . Attends Religious Services:   . Active Member of Clubs or Organizations:   . Attends Archivist Meetings:   [name] Marital Status:   Intimate Partner Violence:   . Fear of Current or Ex-Partner:   . Emotionally Abused:   [name] Physically Abused:   . Sexually Abused:    History reviewed. No pertinent family history.  OBJECTIVE:  Vitals:   08/07/19 0825 08/07/19 0827  BP:  129/83  Pulse:  78  Resp:  17  Temp:  98.3 F (36.8 C)  TempSrc:  Oral  SpO2:  99%  Weight: 194 lb 0.1 oz (88 kg)   Height: 5\' 6"  (1.676 m)      General appearance: alert; appears mildly fatigued, nontoxic; speaking in full sentences and tolerating own secretions HEENT: NCAT; Ears: EACs clear, TMs pearly gray; Eyes: PERRL.  EOM grossly intact. Nose: nares patent without rhinorrhea, Throat: oropharynx clear, tonsils non erythematous or enlarged, uvula midline  Neck: supple without LAD Lungs: unlabored respirations, symmetrical air entry; cough: mild; no respiratory distress; CTAB Heart: regular rate and rhythm.   Skin: warm and dry Psychological: alert and cooperative; normal mood and affect  ASSESSMENT & PLAN:  1. Viral URI with cough   2. Acute bronchitis, unspecified organism     Meds ordered this encounter  Medications  . benzonatate (TESSALON) 100 MG capsule    Sig: Take 1 capsule (100 mg total) by mouth every 8 (eight) hours.    Dispense:  21 capsule     Refill:  0    Order Specific Question:   Supervising Provider    Answer:   Raylene Everts [[id number]]  . predniSONE (DELTASONE) 20 MG tablet    Sig: Take 1 tablet (20 mg total) by mouth 2 (two) times daily with a meal for 5 days.    Dispense:  10 tablet    Refill:  0    Order Specific Question:   Supervising Provider    Answer:   Raylene Everts [[id number]]  . cetirizine (ZYRTEC) 10 MG tablet    Sig: Take 1 tablet (10 mg total) by mouth daily.    Dispense:  30 tablet    Refill:  0    Order Specific Question:   Supervising Provider    Answer:  Blanchie Serve SUE [[id number]]  . fluticasone (FLONASE) 50 MCG/ACT nasal spray    Sig: Place 2 sprays into both nostrils daily.    Dispense:  16 g    Refill:  0    Order Specific Question:   Supervising Provider    Answer:   Raylene Everts [[id number]]   Get plenty of rest and push fluids Prednisone prescribed.  Take as directed and to completion Tessalon Perles prescribed for cough zyrtec for nasal congestion, runny nose, and/or sore throat flonase for nasal congestion and runny nose Use medications daily for symptom relief Use OTC medications like ibuprofen or tylenol as needed fever or pain Call or go to the ED if you have any new or worsening symptoms such as fever, worsening cough, shortness of breath, chest tightness, chest pain, turning blue, changes in mental status, etc...   Reviewed expectations re: course of current medical issues. Questions answered. Outlined signs and symptoms indicating need for more acute intervention. Patient verbalized understanding. After Visit Summary given.         Stacey Drain [place], PA-C 08/07/19 [phone]

## 2019-08-07 NOTE — Discharge Instructions (Signed)
Get plenty of rest and push fluids Prednisone prescribed.  Take as directed and to completion Tessalon Perles prescribed for cough zyrtec for nasal congestion, runny nose, and/or sore throat flonase for nasal congestion and runny nose Use medications daily for symptom relief Use OTC medications like ibuprofen or tylenol as needed fever or pain Call or go to the ED if you have any new or worsening symptoms such as fever, worsening cough, shortness of breath, chest tightness, chest pain, turning blue, changes in mental status, etc..[name]

## 2019-08-07 NOTE — ED Triage Notes (Signed)
Runny nose, congestion and cough that started Monday

## 2019-08-09 ENCOUNTER — Encounter: Payer: Self-pay | Admitting: Emergency Medicine

## 2019-08-09 ENCOUNTER — Other Ambulatory Visit: Payer: Self-pay

## 2019-08-09 ENCOUNTER — Ambulatory Visit
Admission: EM | Admit: 2019-08-09 | Discharge: 2019-08-09 | Disposition: A | Discharge status: Home / Self Care | Payer: Managed Care, Other (non HMO) | Attending: Emergency Medicine | Admitting: Emergency Medicine

## 2019-08-09 DIAGNOSIS — Z1152 Encounter for screening for COVID-19: Secondary | ICD-10-CM

## 2019-08-09 DIAGNOSIS — J069 Acute upper respiratory infection, unspecified: Secondary | ICD-10-CM

## 2019-08-09 DIAGNOSIS — J209 Acute bronchitis, unspecified: Secondary | ICD-10-CM

## 2019-08-09 MED ORDER — BENZONATATE 100 MG PO CAPS
100.0000 mg | ORAL_CAPSULE | Freq: Three times a day (TID) | ORAL | 0 refills | Status: DC
Start: 2019-08-09 — End: 2021-03-25

## 2019-08-09 MED ORDER — AZITHROMYCIN 250 MG PO TABS
250.0000 mg | ORAL_TABLET | Freq: Every day | ORAL | 0 refills | Status: DC
Start: 2019-08-09 — End: 2021-03-25

## 2019-08-09 MED ORDER — ALBUTEROL SULFATE (2.5 MG/3ML) 0.083% IN NEBU
2.5000 mg | INHALATION_SOLUTION | Freq: Four times a day (QID) | RESPIRATORY_TRACT | 12 refills | Status: DC | PRN
Start: 2019-08-09 — End: 2023-01-20

## 2019-08-09 NOTE — ED Provider Notes (Signed)
[place]   [id number] 08/09/19 Arrival Time: 0820   Chief Complaint  Patient presents with  . Cough     SUBJECTIVE: History from: patient.  Seth Hartman is a 54 y.o. male who presents to the urgent care with a complaint of runny nose, congestion, cough for the past 5 days.  Was seen 2 days ago as prescribed Flonase, Tessalon Perles, prednisone and Zyrtec.  Patient reports symptom has been getting worse.  Denies sick exposure to COVID, flu or strep.  Denies recent travel.  Denies previous symptoms in the past.   Denies fever, chills, fatigue, sinus pain, sore throat, SOB, wheezing, chest pain, nausea, changes in bowel or bladder habits.     ROS: As per HPI.  All other pertinent ROS negative.      Past Medical History:  Diagnosis Date  . Asthma   . Asthma   . GERD (gastroesophageal reflux disease)   . PONV (postoperative nausea and vomiting)    Past Surgical History:  Procedure Laterality Date  . BIOPSY  08/02/2019   Procedure: BIOPSY;  Surgeon: Rogene Houston, MD;  [facility];  Service: Endoscopy;;  gastric biopsy  . COLONOSCOPY WITH PROPOFOL N/A 08/02/2019   Procedure: COLONOSCOPY WITH PROPOFOL;  Surgeon: Rogene Houston, MD;  [facility];  Service: Endoscopy;  Laterality: N/A;  830  . ESOPHAGEAL DILATION N/A 08/02/2019   Procedure: ESOPHAGEAL DILATION;  Surgeon: Rogene Houston, MD;  [facility];  Service: Endoscopy;  Laterality: N/A;  . ESOPHAGOGASTRODUODENOSCOPY N/A 11/22/2013   Procedure: ESOPHAGOGASTRODUODENOSCOPY (EGD);  Surgeon: Rogene Houston, MD;  [facility];  Service: Endoscopy;  Laterality: N/A;  1155  . ESOPHAGOGASTRODUODENOSCOPY (EGD) WITH PROPOFOL N/A 08/02/2019   Procedure: ESOPHAGOGASTRODUODENOSCOPY (EGD) WITH PROPOFOL;  Surgeon: Rogene Houston, MD;  [facility];  Service: Endoscopy;  Laterality: N/A;  . MALONEY DILATION N/A 11/22/2013   Procedure: Venia Minks DILATION;  Surgeon:  Rogene Houston, MD;  [facility];  Service: Endoscopy;  Laterality: N/A;  . POLYPECTOMY  08/02/2019   Procedure: POLYPECTOMY;  Surgeon: Rogene Houston, MD;  [facility];  Service: Endoscopy;;  cecal polyp, splenic flexure polyp, sigmoid polyp x2, sigmoid rectal polyp x2, rectal polyp  . pvc's    . SINUS SURGERY WITH INSTATRAK     Allergies  Allergen Reactions  . Penicillins Shortness Of Breath    REACTION: Higher dose resulted in breathing difficulty  . Bee Venom Swelling  . Codeine Nausea And Vomiting, Nausea Only and Other (See Comments)    REACTION: dizziness   No current facility-administered medications on file prior to encounter.   Current Outpatient Medications on File Prior to Encounter  Medication Sig Dispense Refill  . Ascorbic Acid (VITAMIN C WITH ROSE HIPS) 500 MG tablet Take 500 mg by mouth daily.    . benzonatate (TESSALON) 100 MG capsule Take 1 capsule (100 mg total) by mouth every 8 (eight) hours. 21 capsule 0  . cetirizine (ZYRTEC) 10 MG tablet Take 1 tablet (10 mg total) by mouth daily. 30 tablet 0  . cholecalciferol (VITAMIN D) 25 MCG (1000 UNIT) tablet Take 1,000 Units by mouth daily.    [name] esomeprazole (NEXIUM) 40 MG capsule Take 1 capsule (40 mg total) by mouth daily before breakfast. 30 capsule 5  . fluticasone (FLONASE) 50 MCG/ACT nasal spray Place 2 sprays into both nostrils daily. 16 g 0  . HYDROcodone-acetaminophen (NORCO/VICODIN) 5-325 MG tablet Take  1 tablet by mouth every 4 (four) hours as needed. 10 tablet 0  . predniSONE (DELTASONE) 20 MG tablet Take 1 tablet (20 mg total) by mouth 2 (two) times daily with a meal for 5 days. 10 tablet 0   Social History   Socioeconomic History  . Marital status: Divorced    Spouse name: Not on file  . Number of children: Not on file  . Years of education: Not on file  . Highest education level: Not on file  Occupational History  . Not on file  Tobacco Use  . Smoking status: Never Smoker    . Smokeless tobacco: Never Used  Vaping Use  . Vaping Use: Never used  Substance and Sexual Activity  . Alcohol use: No  . Drug use: No  . Sexual activity: Not Currently  Other Topics Concern  . Not on file  Social History Narrative  . Not on file   Social Determinants of Health   Financial Resource Strain:   . Difficulty of Paying Living Expenses:   Food Insecurity:   . Worried About Charity fundraiser in the Last Year:   . Arboriculturist in the Last Year:   Transportation Needs:   . Film/video editor (Medical):   [name] Lack of Transportation (Non-Medical):   Physical Activity:   . Days of Exercise per Week:   . Minutes of Exercise per Session:   Stress:   . Feeling of Stress :   Social Connections:   . Frequency of Communication with Friends and Family:   . Frequency of Social Gatherings with Friends and Family:   . Attends Religious Services:   . Active Member of Clubs or Organizations:   . Attends Archivist Meetings:   [name] Marital Status:   Intimate Partner Violence:   . Fear of Current or Ex-Partner:   . Emotionally Abused:   [name] Physically Abused:   . Sexually Abused:    No family history on file.  OBJECTIVE:  Vitals:   08/09/19 0830 08/09/19 0831  BP: 136/80   Pulse: 84   Resp: 18   Temp: 98.4 F (36.9 C)   TempSrc: Oral   SpO2: 96%   Weight:  194 lb (88 kg)  Height:  5\' 6"  (1.676 m)     General appearance: alert; appears fatigued, but nontoxic; speaking in full sentences and tolerating own secretions HEENT: NCAT; Ears: EACs clear, TMs pearly gray; Eyes: PERRL.  EOM grossly intact. Sinuses: nontender; Nose: nares patent without rhinorrhea, Throat: oropharynx clear, tonsils non erythematous or enlarged, uvula midline  Neck: supple without LAD Lungs: unlabored respirations, symmetrical air entry; cough: moderate; no respiratory distress; CTAB Heart: regular rate and rhythm.  Radial pulses 2+ symmetrical bilaterally Skin: warm and  dry Psychological: alert and cooperative; normal mood and affect  LABS:  No results found for this or any previous visit (from the past 24 hour(s)).   ASSESSMENT & PLAN:  1. URI with cough and congestion   2. Acute bronchitis, unspecified organism   3. Encounter for screening for COVID-19     Meds ordered this encounter  Medications  . albuterol (PROVENTIL) (2.5 MG/3ML) 0.083% nebulizer solution    Sig: Take 3 mLs (2.5 mg total) by nebulization every 6 (six) hours as needed for wheezing or shortness of breath.    Dispense:  75 mL    Refill:  12  . azithromycin (ZITHROMAX) 250 MG tablet    Sig: Take 1 tablet (250  mg total) by mouth daily. Take first 2 tablets together, then 1 every day until finished.    Dispense:  6 tablet    Refill:  0     Discharge Instructions    COVID testing ordered.  It will take between 2-7 days for test results.  Someone will contact you regarding abnormal results.    In the meantime: You should remain isolated in your home for 10 days from symptom onset AND greater than 24  hours after symptoms resolution (absence of fever without the use of fever-reducing medication and improvement in respiratory symptoms), whichever is longer Get plenty of rest and push fluids ProAir and azithromycin was prescribed/take as directed Continue prednisone as prescribed ContinueTessalon Perles prescribed for cough Continue zyrtec for nasal congestion, runny nose, and/or sore throat Continue flonase for nasal congestion and runny nose Use medications daily for symptom relief Use OTC medications like ibuprofen or tylenol as needed fever or pain Call or go to the ED if you have any new or worsening symptoms such as fever, worsening cough, shortness of breath, chest tightness, chest pain, turning blue, changes in mental status, etc...   Reviewed expectations re: course of current medical issues. Questions answered. Outlined signs and symptoms indicating need for more  acute intervention. Patient verbalized understanding. After Visit Summary given.      Note: This document was prepared using Dragon voice recognition software and may include unintentional dictation errors.    Emerson Monte, FNP 08/09/19 [phone]

## 2019-08-09 NOTE — ED Triage Notes (Addendum)
Runny nose, congestion and cough since Monday. Seen here two days ago and states he feeling worse. Up all night coughing.

## 2019-08-09 NOTE — Discharge Instructions (Addendum)
COVID testing ordered.  It will take between 2-7 days for test results.  Someone will contact you regarding abnormal results.    In the meantime: You should remain isolated in your home for 10 days from symptom onset AND greater than 24  hours after symptoms resolution (absence of fever without the use of fever-reducing medication and improvement in respiratory symptoms), whichever is longer Get plenty of rest and push fluids ProAir and azithromycin was prescribed/take as directed Continue prednisone as prescribed ContinueTessalon Perles prescribed for cough Continue zyrtec for nasal congestion, runny nose, and/or sore throat Continue flonase for nasal congestion and runny nose Use medications daily for symptom relief Use OTC medications like ibuprofen or tylenol as needed fever or pain Call or go to the ED if you have any new or worsening symptoms such as fever, worsening cough, shortness of breath, chest tightness, chest pain, turning blue, changes in mental status, etc..

## 2019-08-10 LAB — NOVEL CORONAVIRUS, NAA: SARS-CoV-2, NAA: NOT DETECTED

## 2019-08-10 LAB — SARS-COV-2, NAA 2 DAY TAT

## 2020-04-01 ENCOUNTER — Other Ambulatory Visit (INDEPENDENT_AMBULATORY_CARE_PROVIDER_SITE_OTHER): Payer: Self-pay

## 2020-04-01 DIAGNOSIS — R1319 Other dysphagia: Secondary | ICD-10-CM

## 2020-04-01 DIAGNOSIS — K219 Gastro-esophageal reflux disease without esophagitis: Secondary | ICD-10-CM

## 2020-04-01 MED ORDER — ESOMEPRAZOLE MAGNESIUM 40 MG PO CPDR
40.0000 mg | DELAYED_RELEASE_CAPSULE | Freq: Every day | ORAL | 5 refills | Status: DC
Start: 2020-04-01 — End: 2021-03-25

## 2020-06-23 ENCOUNTER — Ambulatory Visit (INDEPENDENT_AMBULATORY_CARE_PROVIDER_SITE_OTHER): Payer: Managed Care, Other (non HMO) | Admitting: Internal Medicine

## 2020-10-27 ENCOUNTER — Ambulatory Visit (INDEPENDENT_AMBULATORY_CARE_PROVIDER_SITE_OTHER): Payer: Managed Care, Other (non HMO) | Admitting: Internal Medicine

## 2020-12-03 ENCOUNTER — Ambulatory Visit (INDEPENDENT_AMBULATORY_CARE_PROVIDER_SITE_OTHER): Payer: Managed Care, Other (non HMO) | Admitting: Gastroenterology

## 2020-12-03 ENCOUNTER — Encounter (INDEPENDENT_AMBULATORY_CARE_PROVIDER_SITE_OTHER): Payer: Self-pay | Admitting: Gastroenterology

## 2021-03-25 ENCOUNTER — Encounter (INDEPENDENT_AMBULATORY_CARE_PROVIDER_SITE_OTHER): Payer: Self-pay | Admitting: Gastroenterology

## 2021-03-25 ENCOUNTER — Other Ambulatory Visit: Payer: Self-pay

## 2021-03-25 ENCOUNTER — Ambulatory Visit (INDEPENDENT_AMBULATORY_CARE_PROVIDER_SITE_OTHER): Payer: Managed Care, Other (non HMO) | Admitting: Gastroenterology

## 2021-03-25 VITALS — BP 117/75 | HR 80 | Temp 98.5°F | Ht 66.0 in | Wt 193.5 lb

## 2021-03-25 DIAGNOSIS — Z8719 Personal history of other diseases of the digestive system: Secondary | ICD-10-CM

## 2021-03-25 DIAGNOSIS — K760 Fatty (change of) liver, not elsewhere classified: Secondary | ICD-10-CM

## 2021-03-25 DIAGNOSIS — K219 Gastro-esophageal reflux disease without esophagitis: Secondary | ICD-10-CM

## 2021-03-25 MED ORDER — OMEPRAZOLE 20 MG PO CPDR: 20.0000 mg | DELAYED_RELEASE_CAPSULE | Freq: Every day | ORAL | 3 refills | Status: DC

## 2021-03-25 NOTE — Progress Notes (Signed)
[name], M.D. Gastroenterology & Hepatology [facility] [address] [place], [place] [address]  Primary Care Physician: Redmond School, MD [address] [place] [address]  I will communicate my assessment and recommendations to the referring MD via EMR.  Problems: Esophageal stricture GERD  History of Present Illness: Seth Hartman is a 56 y.o. male with past medical history of GERD, asthma and esophageal stricture, who presents for follow up of dysphagia and GERD.  The patient was last seen on 06/24/2019. At that time, the patient was scheduled for an EGD due to recurrent dysphagia and also underwent a screening colonoscopy.  Findings of the procedures are described below.  He was given a prescription for pantoprazole 40 mg every day.  Denies having any complaint. No recurrent dysphagia, odynophagia, heartburn. He is taking omeprazole 20 mg every other day since December as he ran out of the pantoprazole medication. The patient denies having any nausea, vomiting, fever, chills, hematochezia, melena, hematemesis, abdominal distention, abdominal pain, diarrhea, jaundice, pruritus. Has lost a few lb as he has changed his diet since he was having higher glucose levels.  Patient reports that he has been checked yearly for hepatitis B and C [facility] [employer], and he reports that he has always been negative.  Had really presented mildly elevated ALT in the low 40s range.  Last EGD: 07/2019 The hypopharynx was normal. Findings: The examined esophagus was normal. One benign-appearing, intrinsic mild stenosis was found at GEJ;40 cm from the incisors. The stenosis was traversed. The scope was withdrawn. Dilation was performed with a Maloney dilator with no resistance at 75 Fr. The dilation site was examined following endoscope reinsertion and showed mild mucosal disruption, mild improvement in luminal narrowing and no  perforation. Patchy moderate inflammation characterized by adherent blood, congestion (edema), erosions, erythema and friability was found in the gastric body, on the lesser curvature of the stomach, on the posterior wall of the stomach and in the gastric antrum. Biopsies were taken with a cold forceps for histology. The  pathology specimen was placed into Bottle Number 1. The exam of the stomach was otherwise normal. The duodenal bulb and second portion of the duodenum were normal.  Last Colonoscopy: 07/2019 The perianal and digital rectal examinations were normal. A 10 mm polyp was found in the cecum. The polyp was sessile. Area was successfully injected with 3 mL Eleview for lesion assessment, and this injection appeared to lift the lesion adequately. The polyp was removed with a hot snare. Resection and retrieval were complete. The pathology specimen was placed into Bottle Number 2. A 6 mm polyp was found in the splenic flexure. The polyp was removed with a cold snare. Resection and retrieval were complete. The pathology specimen was placed into Bottle Number 3 Six polyps were found in the rectum, recto-sigmoid colon and sigmoid colon. The polyps were 4 to 6 mm in size. These polyps were removed with a hot snare. Resection and retrieval were complete. The pathology specimen was placed into Bottle Number 3. The exam was otherwise normal throughout the examined colon. External hemorrhoids were found during retroflexion. The hemorrhoids were small. Anal papilla(e) were hypertrophied.  Path: A. STOMACH, BIOPSY:  - Gastric oxyntic mucosa showing marked nonspecific reactive gastropathy  with erosion  - Warthin Starry stain is negative for Helicobacter pylori   B. COLON, CECUM, POLYPECTOMY:  - Sessile serrated polyp without cytologic dysplasia   C. COLON, SPLENIC FLEXURE, SIGMOID POLYPECTOMY:  -  Tubular adenoma(s) without high-grade dysplasia or malignancy  - Hyperplastic polyp(s  Recommended  repeat colonoscopy in 3 years.  Past Medical History: Past Medical History:  Diagnosis Date   Asthma    Asthma    GERD (gastroesophageal reflux disease)    PONV (postoperative nausea and vomiting)     Past Surgical History: Past Surgical History:  Procedure Laterality Date   BIOPSY  08/02/2019   Procedure: BIOPSY;  Surgeon: Rogene Houston, MD;  [facility];  Service: Endoscopy;;  gastric biopsy   COLONOSCOPY WITH PROPOFOL N/A 08/02/2019   rehman:One 10 mm polyp in the cecum-sessile serrated polyp, one 6 mm polyp at splenic flexure, tubular adenoma, six 4-25mm polyps in rectum, at the recto sigmoid colon and in sigmoid colon, tubular adenoma. external hemorrhoids, anal papillae were hypertrophied   ESOPHAGEAL DILATION N/A 08/02/2019   Procedure: ESOPHAGEAL DILATION;  Surgeon: Rogene Houston, MD;  [facility];  Service: Endoscopy;  Laterality: N/A;   ESOPHAGOGASTRODUODENOSCOPY N/A 11/22/2013   Procedure: ESOPHAGOGASTRODUODENOSCOPY (EGD);  Surgeon: Rogene Houston, MD;  [facility];  Service: Endoscopy;  Laterality: N/A;  1155   ESOPHAGOGASTRODUODENOSCOPY (EGD) WITH PROPOFOL N/A 08/02/2019   Rehman: normal hypopharynx, normal esophagus, bening-appearing esophageal stenosis at GEJ, dilated, gastritis (no h pylori), normal duodenal bulb and second portion of duodenum   MALONEY DILATION N/A 11/22/2013   Procedure: MALONEY DILATION;  Surgeon: Rogene Houston, MD;  [facility];  Service: Endoscopy;  Laterality: N/A;   POLYPECTOMY  08/02/2019   Procedure: POLYPECTOMY;  Surgeon: Rogene Houston, MD;  [facility];  Service: Endoscopy;;  cecal polyp, splenic flexure polyp, sigmoid polyp x2, sigmoid rectal polyp x2, rectal polyp   pvc's     SINUS SURGERY WITH INSTATRAK      Family History:History reviewed. No pertinent family history.  Social History: Social History   Tobacco Use  Smoking Status Never  Smokeless Tobacco Never    Social History   Substance and Sexual Activity  Alcohol Use No   Social History   Substance and Sexual Activity  Drug Use No    Allergies: Allergies  Allergen Reactions   Penicillins Shortness Of Breath    REACTION: Higher dose resulted in breathing difficulty   Bee Venom Swelling   Codeine Nausea And Vomiting, Nausea Only and Other (See Comments)    REACTION: dizziness    Medications: Current Outpatient Medications  Medication Sig Dispense Refill   albuterol (PROVENTIL) (2.5 MG/3ML) 0.083% nebulizer solution Take 3 mLs (2.5 mg total) by nebulization every 6 (six) hours as needed for wheezing or shortness of breath. 75 mL 12   Ascorbic Acid (VITAMIN C WITH ROSE HIPS) 500 MG tablet Take 500 mg by mouth daily.     cetirizine (ZYRTEC) 10 MG tablet Take 1 tablet (10 mg total) by mouth daily. 30 tablet 0   cholecalciferol (VITAMIN D) 25 MCG (1000 UNIT) tablet Take 1,000 Units by mouth daily.     esomeprazole (NEXIUM) 40 MG capsule Take 40 mg by mouth every other day.     vitamin k 100 MCG tablet Take 100 mcg by mouth daily.     No current facility-administered medications for this visit.    Review of Systems: GENERAL: negative for malaise, night sweats HEENT: No changes in hearing or vision, no nose bleeds or other nasal problems. NECK: Negative for lumps, goiter, pain and significant neck swelling RESPIRATORY: Negative for cough, wheezing CARDIOVASCULAR: Negative for chest pain, leg  swelling, palpitations, orthopnea GI: SEE HPI MUSCULOSKELETAL: Negative for joint pain or swelling, back pain, and muscle pain. SKIN: Negative for lesions, rash PSYCH: Negative for sleep disturbance, mood disorder and recent psychosocial stressors. HEMATOLOGY Negative for prolonged bleeding, bruising easily, and swollen nodes. ENDOCRINE: Negative for cold or heat intolerance, polyuria, polydipsia and goiter. NEURO: negative for tremor, gait imbalance, syncope and seizures. The remainder of  the review of systems is noncontributory.   Physical Exam: BP 117/75 (BP Location: Left Arm, Patient Position: Sitting, Cuff Size: Large)    Pulse 80    Temp 98.5 F (36.9 C) (Oral)    Ht 5\' 6"  (1.676 m)    Wt 193 lb 8 oz (87.8 kg)    BMI 31.23 kg/m  GENERAL: The patient is AO x3, in no acute distress. HEENT: Head is normocephalic and atraumatic. EOMI are intact. Mouth is well hydrated and without lesions. NECK: Supple. No masses LUNGS: Clear to auscultation. No presence of rhonchi/wheezing/rales. Adequate chest expansion HEART: RRR, normal s1 and s2. ABDOMEN: Soft, nontender, no guarding, no peritoneal signs, and nondistended. BS +. No masses. EXTREMITIES: Without any cyanosis, clubbing, rash, lesions or edema. NEUROLOGIC: AOx3, no focal motor deficit. SKIN: no jaundice, no rashes  Imaging/Labs: as above  I personally reviewed and interpreted the available labs, imaging and endoscopic files.  Impression and Plan: Seth Hartman is a 56 y.o. male with past medical history of GERD, asthma and esophageal stricture, who presents for follow up of dysphagia and GERD.  The patient has presented adequate control of his symptoms with low-dose omeprazole every other day.  I encouraged him to take medication on a daily basis and this may provide better suppression of his acid reflux while also preventing recurrent peptic strictures.  He understood and agreed.  Patient was provided indications in the proper fashion to take his medicine.  - Continue omeprazole 20 mg qday - Explained presumed etiology of reflux symptoms. Instruction provided in the use of antireflux medication - patient should take medication in the morning 30-45 minutes before eating breakfast. Discussed avoidance of eating within 2 hours of lying down to sleep and benefit of blocks to elevate head of bed. Also, will benefit from avoiding carbonated drinks/sodas or food that has tomatoes, spicy or greasy food.  All questions were  answered.      Harvel Quale, MD Gastroenterology and Hepatology [facility]

## 2021-03-25 NOTE — Patient Instructions (Signed)
Continue omeprazole 20 mg qday Explained presumed etiology of reflux symptoms. Instruction provided in the use of antireflux medication - patient should take medication in the morning 30-45 minutes before eating breakfast. Discussed avoidance of eating within 2 hours of lying down to sleep and benefit of blocks to elevate head of bed. Also, will benefit from avoiding carbonated drinks/sodas or food that has tomatoes, spicy or greasy food. 

## 2021-11-09 ENCOUNTER — Encounter (INDEPENDENT_AMBULATORY_CARE_PROVIDER_SITE_OTHER): Payer: Self-pay | Admitting: Gastroenterology

## 2021-11-09 ENCOUNTER — Ambulatory Visit (INDEPENDENT_AMBULATORY_CARE_PROVIDER_SITE_OTHER): Payer: Self-pay | Admitting: Gastroenterology

## 2021-11-09 VITALS — BP 137/79 | HR 81 | Temp 98.0°F | Ht 66.0 in | Wt 191.4 lb

## 2021-11-09 DIAGNOSIS — K219 Gastro-esophageal reflux disease without esophagitis: Secondary | ICD-10-CM

## 2021-11-09 MED ORDER — ESOMEPRAZOLE MAGNESIUM 40 MG PO CPDR: 40.0000 mg | DELAYED_RELEASE_CAPSULE | Freq: Two times a day (BID) | ORAL | 1 refills | Status: DC

## 2021-11-09 NOTE — Progress Notes (Signed)
Referring Provider: Redmond School, MD Primary Care Physician:  Redmond School, MD Primary GI Physician: Jenetta Downer   Chief Complaint  Patient presents with   Gastroesophageal Reflux    Patient here today for a follow up on Gerd. Patient states the omeprazole 20 mg bid is not helping. He says he has a cough that he cant get rid of.    HPI:   Seth Hartman is a 56 y.o. male with past medical history of GERD, asthma and esophageal stricture  Patient presenting today for follow up of GERD.  Last seen February 2023, at that time recommended to continue omeprazole '20mg'$  daily.   Present:  He states that he noticed a dry cough, needing to clear his throat and hoarse voice a few weeks ago. He does note he had some sinus issues that he was taking acetaminophen but has since stopped this. He did increase his omeprazole '20mg'$  to BID when symptoms began, but did not note much improvement with this. Has rare dysphagia with breads. Denies actual heartburn or acid regurgitation. No sore throat.   No red flag symptoms. Patient denies melena, hematochezia, nausea, vomiting, diarrhea, constipation, dysphagia, odyonophagia, early satiety or weight loss.   Last EGD: 07/2019 The hypopharynx was normal. The examined esophagus was normal. One benign-appearing, intrinsic mild stenosis was found at GEJ;40 cm from the incisors. The stenosis was traversed. The scope was withdrawn. Dilation was performed with a Maloney dilator with no resistance at 67 Fr. The dilation site was examined following endoscope reinsertion and showed mild mucosal disruption, mild improvement in luminal narrowing and no perforation. Patchy moderate inflammation characterized by adherent blood, congestion (edema), erosions, erythema and friability was found in the gastric body, on the lesser curvature of the stomach, on the posterior wall of the stomach and in the gastric antrum. Biopsies were taken with a cold forceps for histology. The   pathology specimen was placed into Bottle Number 1. The exam of the stomach was otherwise normal. The duodenal bulb and second portion of the duodenum were normal.   Last Colonoscopy: 07/2019 The perianal and digital rectal examinations were normal. A 10 mm polyp was found in the cecum. The polyp was sessile. Area was successfully injected with 3 mL Eleview for lesion assessment, and this injection appeared to lift the lesion adequately. The polyp was removed with a hot snare. Resection and retrieval were complete. The pathology specimen was placed into Bottle Number 2. A 6 mm polyp was found in the splenic flexure. The polyp was removed with a cold snare. Resection and retrieval were complete. The pathology specimen was placed into Bottle Number 3 Six polyps were found in the rectum, recto-sigmoid colon and sigmoid colon. The polyps were 4 to 6 mm in size. These polyps were removed with a hot snare. Resection and retrieval were complete. The pathology specimen was placed into Bottle Number 3. The exam was otherwise normal throughout the examined colon. External hemorrhoids were found during retroflexion. The hemorrhoids were small. Anal papilla(e) were hypertrophied.   Path: A. STOMACH, BIOPSY:  - Gastric oxyntic mucosa showing marked nonspecific reactive gastropathy  with erosion  - Warthin Starry stain is negative for Helicobacter pylori   B. COLON, CECUM, POLYPECTOMY:  - Sessile serrated polyp without cytologic dysplasia   C. COLON, SPLENIC FLEXURE, SIGMOID POLYPECTOMY:  - Tubular adenoma(s) without high-grade dysplasia or malignancy  - Hyperplastic polyp(s   Recommended repeat colonoscopy in 3 years.    Past Medical History:  Diagnosis Date  Asthma    Asthma    GERD (gastroesophageal reflux disease)    PONV (postoperative nausea and vomiting)     Past Surgical History:  Procedure Laterality Date   BIOPSY  08/02/2019   Procedure: BIOPSY;  Surgeon: Rogene Houston, MD;   [facility];  Service: Endoscopy;;  gastric biopsy   COLONOSCOPY WITH PROPOFOL N/A 08/02/2019   rehman:One 10 mm polyp in the cecum-sessile serrated polyp, one 6 mm polyp at splenic flexure, tubular adenoma, six 4-92m polyps in rectum, at the recto sigmoid colon and in sigmoid colon, tubular adenoma. external hemorrhoids, anal papillae were hypertrophied   ESOPHAGEAL DILATION N/A 08/02/2019   Procedure: ESOPHAGEAL DILATION;  Surgeon: RRogene Houston MD;  [facility];  Service: Endoscopy;  Laterality: N/A;   ESOPHAGOGASTRODUODENOSCOPY N/A 11/22/2013   Procedure: ESOPHAGOGASTRODUODENOSCOPY (EGD);  Surgeon: NRogene Houston MD;  [facility];  Service: Endoscopy;  Laterality: N/A;  1155   ESOPHAGOGASTRODUODENOSCOPY (EGD) WITH PROPOFOL N/A 08/02/2019   Rehman: normal hypopharynx, normal esophagus, bening-appearing esophageal stenosis at GEJ, dilated, gastritis (no h pylori), normal duodenal bulb and second portion of duodenum   MALONEY DILATION N/A 11/22/2013   Procedure: MALONEY DILATION;  Surgeon: NRogene Houston MD;  [facility];  Service: Endoscopy;  Laterality: N/A;   POLYPECTOMY  08/02/2019   Procedure: POLYPECTOMY;  Surgeon: RRogene Houston MD;  [facility];  Service: Endoscopy;;  cecal polyp, splenic flexure polyp, sigmoid polyp x2, sigmoid rectal polyp x2, rectal polyp   pvc's     SINUS SURGERY WITH INSTATRAK      Current Outpatient Medications  Medication Sig Dispense Refill   albuterol (PROVENTIL) (2.5 MG/3ML) 0.083% nebulizer solution Take 3 mLs (2.5 mg total) by nebulization every 6 (six) hours as needed for wheezing or shortness of breath. 75 mL 12   Ascorbic Acid (VITAMIN C WITH ROSE HIPS) 500 MG tablet Take 500 mg by mouth daily.     cetirizine (ZYRTEC) 10 MG tablet Take 1 tablet (10 mg total) by mouth daily. 30 tablet 0   cholecalciferol (VITAMIN D) 25 MCG (1000 UNIT) tablet Take 1,000 Units by mouth daily.      omeprazole (PRILOSEC) 20 MG capsule Take 1 capsule (20 mg total) by mouth daily. 90 capsule 3   thiamine (VITAMIN B1) 100 MG tablet Take 100 mg by mouth daily.     vitamin k 100 MCG tablet Take 100 mcg by mouth daily.     No current facility-administered medications for this visit.    Allergies as of 11/09/2021 - Review Complete 11/09/2021  Allergen Reaction Noted   Penicillins Shortness Of Breath 11/18/2011   Bee venom Swelling 11/18/2011   Codeine Nausea And Vomiting, Nausea Only, and Other (See Comments) 11/18/2011    History reviewed. No pertinent family history.  Social History   Socioeconomic History   Marital status: Divorced    Spouse name: Not on file   Number of children: Not on file   Years of education: Not on file   Highest education level: Not on file  Occupational History   Not on file  Tobacco Use   Smoking status: Never   Smokeless tobacco: Never  Vaping Use   Vaping Use: Never used  Substance and Sexual Activity   Alcohol use: No   Drug use: No   Sexual activity: Not Currently  Other Topics Concern   Not on file  Social History Narrative   Not on file  Social Determinants of Health   Financial Resource Strain: Not on file  Food Insecurity: Not on file  Transportation Needs: Not on file  Physical Activity: Not on file  Stress: Not on file  Social Connections: Not on file   Review of systems General: negative for malaise, night sweats, fever, chills, weight loss Neck: Negative for lumps, goiter, pain and significant neck swelling Resp: Negative for cough, wheezing, dyspnea at rest CV: Negative for chest pain, leg swelling, palpitations, orthopnea GI: denies melena, hematochezia, nausea, vomiting, diarrhea, constipation, dysphagia, odyonophagia, early satiety or unintentional weight loss. +hoarseness +dry cough  MSK: Negative for joint pain or swelling, back pain, and muscle pain. Derm: Negative for itching or rash Psych: Denies depression,  anxiety, memory loss, confusion. No homicidal or suicidal ideation.  Heme: Negative for prolonged bleeding, bruising easily, and swollen nodes. Endocrine: Negative for cold or heat intolerance, polyuria, polydipsia and goiter. Neuro: negative for tremor, gait imbalance, syncope and seizures. The remainder of the review of systems is noncontributory.  Physical Exam: BP 137/79 (BP Location: Left Arm, Patient Position: Sitting, Cuff Size: Large)   Pulse 81   Temp 98 F (36.7 C) (Oral)   Ht '5\' 6"'$  (1.676 m)   Wt 191 lb 6.4 oz (86.8 kg)   BMI 30.89 kg/m  General:   Alert and oriented. No distress noted. Pleasant and cooperative.  Head:  Normocephalic and atraumatic. Eyes:  Conjuctiva clear without scleral icterus. Mouth:  Oral mucosa pink and moist. Good dentition. No lesions. Heart: Normal rate and rhythm, s1 and s2 heart sounds present.  Lungs: Clear lung sounds in all lobes. Respirations equal and unlabored. Abdomen:  +BS, soft, non-tender and non-distended. No rebound or guarding. No HSM or masses noted. Derm: No palmar erythema or jaundice Msk:  Symmetrical without gross deformities. Normal posture. Extremities:  Without edema. Neurologic:  Alert and  oriented x4 Psych:  Alert and cooperative. Normal mood and affect.  Invalid input(s): "6 MONTHS"   ASSESSMENT: Seth Hartman is a 56 y.o. male presenting today with dry cough and hoarseness/GERD.  Patient with history of GERD, presenting in the past with dry cough and hoarseness, has been maintained on low dose omeprazole daily for the past few years, recently with recurrence of dry cough and hoarseness, he increased PPI to BID without much change. He has no heartburn or acid regurgitation and while these symptoms could be secondary to allergies/sinus issues, given his historical GERD presentation, suspect this is more of a silent reflux issue. Will stop omeprazole and start nexium '40mg'$  BID, he should continue with reflux precautions,  Avoiding greasy, spicy, fried, citrus foods, and be mindful that caffeine, carbonated drinks, chocolate and alcohol can increase reflux symptoms, Stay upright 2-3 hours after eating, prior to lying down and avoid eating late in the evenings. He will update me in about 1 month on how symptoms are doing. May consider repeating EGD if no improvement.    PLAN:  Stop omeprazole  2. Start nexium '40mg'$  daily  3.  Continue with reflux precautions 4. Call with an update in 1 month   All questions were answered, patient verbalized understanding and is in agreement with plan as outlined above.   Follow Up: 6 months   Keylah Darwish L. Alver Sorrow, MSN, APRN, AGNP-C Adult-Gerontology Nurse Practitioner [facility]

## 2021-11-09 NOTE — Patient Instructions (Signed)
We will stop omeprazole '20mg'$  and start nexium '40mg'$  twice a day  Avoid greasy, spicy, fried, citrus foods, and be mindful that caffeine, carbonated drinks, chocolate and alcohol can increase reflux symptoms Stay upright 2-3 hours after eating, prior to lying down and avoid eating late in the evenings.  Please call with an update in about 1 month, we may need to discuss repeating EGD at that time if symptoms not improved  Follow up 6 months

## 2021-11-14 ENCOUNTER — Encounter: Payer: Self-pay | Admitting: Emergency Medicine

## 2021-11-14 ENCOUNTER — Other Ambulatory Visit: Payer: Self-pay

## 2021-11-14 ENCOUNTER — Ambulatory Visit
Admission: EM | Admit: 2021-11-14 | Discharge: 2021-11-14 | Disposition: A | Discharge status: Home / Self Care | Payer: 59 | Attending: Family Medicine | Admitting: Family Medicine

## 2021-11-14 DIAGNOSIS — R5383 Other fatigue: Secondary | ICD-10-CM

## 2021-11-14 DIAGNOSIS — K219 Gastro-esophageal reflux disease without esophagitis: Secondary | ICD-10-CM

## 2021-11-14 DIAGNOSIS — R0982 Postnasal drip: Secondary | ICD-10-CM

## 2021-11-14 MED ORDER — SUCRALFATE 1 G PO TABS: 1.0000 g | ORAL_TABLET | Freq: Three times a day (TID) | ORAL | 0 refills | Status: DC | PRN

## 2021-11-14 MED ORDER — FLUTICASONE PROPIONATE 50 MCG/ACT NA SUSP: 1.0000 | Freq: Two times a day (BID) | NASAL | 2 refills | Status: DC

## 2021-11-14 NOTE — ED Triage Notes (Signed)
Patient c/o acid reflux x 3 weeks, causing a cough.  Patient has seen his GI physician regarding this and started on Nexium; doesn't feel that the meds are working.

## 2021-11-17 NOTE — ED Provider Notes (Addendum)
[facility]    CSN: [id number] Arrival date & time: 11/14/21  0803      History   Chief Complaint Chief Complaint  Patient presents with   Gastroesophageal Reflux    HPI Seth Hartman is a 56 y.o. male.   Patient presenting today with 3-week history of postnasal drip, cough and what he believes to be acid reflux.  He states he is also felt very "worn out "and weak in general the past month or so which is unlike him.  He is unsure which may be causing his ongoing cough, the cough is not particularly productive and is not associated with any shortness of breath, chest tightness, wheezing, palpitations, fevers or upper respiratory symptoms such as sore throat, rhinorrhea.  He is being followed by GI currently for his acid reflux, started on Nexium but has not felt a significant benefit from this.  Not trying anything over-the-counter additionally for his symptoms.  Does have a history of asthma and allergies not currently on anything for these.    Past Medical History:  Diagnosis Date   Asthma    Asthma    GERD (gastroesophageal reflux disease)    PONV (postoperative nausea and vomiting)     Patient Active Problem List   Diagnosis Date Noted   Fatty liver 03/25/2021   History of esophageal stricture 03/25/2021   GERD (gastroesophageal reflux disease) 06/24/2019   Asthma, chronic 11/15/2013    Past Surgical History:  Procedure Laterality Date   BIOPSY  08/02/2019   Procedure: BIOPSY;  Surgeon: Rogene Houston, MD;  [facility];  Service: Endoscopy;;  gastric biopsy   COLONOSCOPY WITH PROPOFOL N/A 08/02/2019   rehman:One 10 mm polyp in the cecum-sessile serrated polyp, one 6 mm polyp at splenic flexure, tubular adenoma, six 4-44m polyps in rectum, at the recto sigmoid colon and in sigmoid colon, tubular adenoma. external hemorrhoids, anal papillae were hypertrophied   ESOPHAGEAL DILATION N/A 08/02/2019   Procedure: ESOPHAGEAL DILATION;  Surgeon:  RRogene Houston MD;  [facility];  Service: Endoscopy;  Laterality: N/A;   ESOPHAGOGASTRODUODENOSCOPY N/A 11/22/2013   Procedure: ESOPHAGOGASTRODUODENOSCOPY (EGD);  Surgeon: NRogene Houston MD;  [facility];  Service: Endoscopy;  Laterality: N/A;  1155   ESOPHAGOGASTRODUODENOSCOPY (EGD) WITH PROPOFOL N/A 08/02/2019   Rehman: normal hypopharynx, normal esophagus, bening-appearing esophageal stenosis at GEJ, dilated, gastritis (no h pylori), normal duodenal bulb and second portion of duodenum   MALONEY DILATION N/A 11/22/2013   Procedure: MALONEY DILATION;  Surgeon: NRogene Houston MD;  [facility];  Service: Endoscopy;  Laterality: N/A;   POLYPECTOMY  08/02/2019   Procedure: POLYPECTOMY;  Surgeon: RRogene Houston MD;  [facility];  Service: Endoscopy;;  cecal polyp, splenic flexure polyp, sigmoid polyp x2, sigmoid rectal polyp x2, rectal polyp   pvc's     SINUS SURGERY WITH INSTATRAK         Home Medications    Prior to Admission medications   Medication Sig Start Date End Date Taking? Authorizing Provider  albuterol (PROVENTIL) (2.5 MG/3ML) 0.083% nebulizer solution Take 3 mLs (2.5 mg total) by nebulization every 6 (six) hours as needed for wheezing or shortness of breath. 08/09/19  Yes Avegno, KDarrelyn Hillock FNP  Ascorbic Acid (VITAMIN C WITH ROSE HIPS) 500 MG tablet Take 500 mg by mouth daily.   Yes [provider]  cetirizine (ZYRTEC) 10 MG tablet Take 1 tablet (10 mg total) by mouth daily.  08/07/19  Yes Wurst, [place], PA-C  cholecalciferol (VITAMIN D) 25 MCG (1000 UNIT) tablet Take 1,000 Units by mouth daily.   Yes [provider]  esomeprazole (NEXIUM) 40 MG capsule Take 1 capsule (40 mg total) by mouth 2 (two) times daily. 11/09/21  Yes Carlan, Chelsea L, NP  fluticasone (FLONASE) 50 MCG/ACT nasal spray Place 1 spray into both nostrils 2 (two) times daily. 11/14/21  Yes Volney American, PA-C  sucralfate  (CARAFATE) 1 g tablet Take 1 tablet (1 g total) by mouth 3 (three) times daily as needed. May dissolve 1 tab into glass of water and drink prior to meals as needed 11/14/21  Yes Volney American, PA-C  thiamine (VITAMIN B1) 100 MG tablet Take 100 mg by mouth daily.   Yes [provider]  vitamin k 100 MCG tablet Take 100 mcg by mouth daily.   Yes [provider]    Family History History reviewed. No pertinent family history.  Social History Social History   Tobacco Use   Smoking status: Never   Smokeless tobacco: Never  Vaping Use   Vaping Use: Never used  Substance Use Topics   Alcohol use: No   Drug use: No     Allergies   Penicillins, Bee venom, and Codeine   Review of Systems Review of Systems Per HPI  Physical Exam Triage Vital Signs ED Triage Vitals  Enc Vitals Group     BP 11/14/21 0816 129/71     Pulse Rate 11/14/21 0816 75     Resp 11/14/21 0816 18     Temp 11/14/21 0816 98.1 F (36.7 C)     Temp Source 11/14/21 0816 Oral     SpO2 11/14/21 0816 95 %     Weight 11/14/21 0817 190 lb (86.2 kg)     Height 11/14/21 0817 '5\' 6"'$  (1.676 m)     Head Circumference --      Peak Flow --      Pain Score 11/14/21 0817 0     Pain Loc --      Pain Edu? --      Excl. in [place]? --    No data found.  Updated Vital Signs BP 129/71 (BP Location: Right Arm)   Pulse 75   Temp 98.1 F (36.7 C) (Oral)   Resp 18   Ht '5\' 6"'$  (1.676 m)   Wt 190 lb (86.2 kg)   SpO2 95%   BMI 30.67 kg/m   Visual Acuity Right Eye Distance:   Left Eye Distance:   Bilateral Distance:    Right Eye Near:   Left Eye Near:    Bilateral Near:     Physical Exam Vitals and nursing note reviewed.  Constitutional:      Appearance: Normal appearance.  HENT:     Head: Atraumatic.     Right Ear: Tympanic membrane normal.     Left Ear: Tympanic membrane normal.     Nose: Nose normal.     Comments: Nasal turbinates boggy, erythematous and edematous bilaterally     Mouth/Throat:     Mouth: Mucous membranes are moist.     Pharynx: Posterior oropharyngeal erythema present.     Comments: Postnasal drainage pattern Eyes:     Extraocular Movements: Extraocular movements intact.     Conjunctiva/sclera: Conjunctivae normal.  Cardiovascular:     Rate and Rhythm: Normal rate and regular rhythm.  Pulmonary:     Effort: Pulmonary effort is normal.     Breath  sounds: Normal breath sounds.  Musculoskeletal:        General: Normal range of motion.     Cervical back: Normal range of motion and neck supple.  Skin:    General: Skin is warm and dry.  Neurological:     General: No focal deficit present.     Mental Status: He is oriented to person, place, and time.     Motor: No weakness.     Gait: Gait normal.  Psychiatric:        Mood and Affect: Mood normal.        Thought Content: Thought content normal.        Judgment: Judgment normal.    UC Treatments / Results  Labs (all labs ordered are listed, but only abnormal results are displayed) Labs Reviewed - No data to display  EKG   Radiology No results found.  Procedures Procedures (including critical care time)  Medications Ordered in UC Medications - No data to display  Initial Impression / Assessment and Plan / UC Course  I have reviewed the triage vital signs and the nursing notes.  Pertinent labs & imaging results that were available during my care of the patient were reviewed by me and considered in my medical decision making (see chart for details).     Unclear if postnasal drainage is causing 100% of his ongoing cough, could possibly have a reflux component as well.  We will add Carafate prior to mealtimes to his Nexium regimen to see if this gets better control of his esophageal burning, brash symptoms.  We will add Flonase and antihistamines daily to help control his allergies and postnasal drainage.  EKG was done today for further evaluation revealing normal sinus rhythm at 67 bpm  and bundle branch blocks which he states are chronic for him.  No acute ST or T wave changes noted.  Continue to monitor for improvement, diet changes and lifestyle modifications recommended.  Return for any worsening symptoms.  Final Clinical Impressions(s) / UC Diagnoses   Final diagnoses:  Post-nasal drip  Gastroesophageal reflux disease, unspecified whether esophagitis present  Other fatigue   Discharge Instructions   None    ED Prescriptions     Medication Sig Dispense Auth. Provider   fluticasone (FLONASE) 50 MCG/ACT nasal spray Place 1 spray into both nostrils 2 (two) times daily. 16 g Volney American, PA-C   sucralfate (CARAFATE) 1 g tablet Take 1 tablet (1 g total) by mouth 3 (three) times daily as needed. May dissolve 1 tab into glass of water and drink prior to meals as needed 90 tablet Volney American, [place]      PDMP not reviewed this encounter.   Volney American, [place] 11/17/21 [place], [place], PA-C 11/17/21 [phone]

## 2021-12-11 ENCOUNTER — Other Ambulatory Visit: Payer: Self-pay | Admitting: Family Medicine

## 2022-03-24 ENCOUNTER — Ambulatory Visit (INDEPENDENT_AMBULATORY_CARE_PROVIDER_SITE_OTHER): Payer: Self-pay | Admitting: Gastroenterology

## 2022-05-09 ENCOUNTER — Encounter (INDEPENDENT_AMBULATORY_CARE_PROVIDER_SITE_OTHER): Payer: Self-pay | Admitting: Gastroenterology

## 2022-05-09 ENCOUNTER — Ambulatory Visit (INDEPENDENT_AMBULATORY_CARE_PROVIDER_SITE_OTHER): Payer: Self-pay | Admitting: Gastroenterology

## 2022-05-09 VITALS — BP 133/82 | HR 73 | Temp 97.8°F | Ht 66.0 in | Wt 201.2 lb

## 2022-05-09 DIAGNOSIS — K219 Gastro-esophageal reflux disease without esophagitis: Secondary | ICD-10-CM

## 2022-05-09 NOTE — Patient Instructions (Addendum)
Continue Nexium 20 mg qday Proceed with repeat colonoscopy in early July 2024

## 2022-05-09 NOTE — Progress Notes (Signed)
Seth Hartman, M.D. Gastroenterology & Hepatology [facility] [address] [place], [place] [address]  Primary Care Physician: [employer], [place] [phone]  I will communicate my assessment and recommendations to the referring MD via EMR.  Problems: GERD  History of Present Illness: Seth Hartman is a 57 y.o. male with PMH asthma and GERD, who presents for follow up of GERD.  The patient was last seen on 11/09/21. At that time, the patient was advised to start Nexium 40 mg qday.  State she was having heartburn issues in the past when he was eating peanut butter or jello. Since he stopped this food, he noticed improvement in his symptoms.  Due to this, he decided to increase the dosage of his Nexium.  He denies having any heartburn while taking Nexium 20 mg qday. States he has had some mild dysphagia with solids, which he reports as a discomfort in the the sternal notch 15 minutes after eating.  The patient denies having any nausea, vomiting, fever, chills, hematochezia, melena, hematemesis, abdominal distention, abdominal pain, diarrhea, jaundice, pruritus. Has gained a few lb recently. He has noticed some growling in his abdomen but no other complaints.  Last EGD: 07/2019 The hypopharynx was normal. The examined esophagus was normal. One benign-appearing, intrinsic mild stenosis was found at GEJ;40 cm from the incisors. The stenosis was traversed. The scope was withdrawn. Dilation was performed with a Maloney dilator with no resistance at 63 Fr. The dilation site was examined following endoscope reinsertion and showed mild mucosal disruption, mild improvement in luminal narrowing and no perforation. Patchy moderate inflammation characterized by adherent blood, congestion (edema), erosions, erythema and friability was found in the gastric body, on the lesser curvature of the stomach, on the posterior wall of  the stomach and in the gastric antrum. Biopsies were taken with a cold forceps for histology. The  pathology specimen was placed into Bottle Number 1. The exam of the stomach was otherwise normal. The duodenal bulb and second portion of the duodenum were normal.   Last Colonoscopy: 07/2019 The perianal and digital rectal examinations were normal. A 10 mm polyp was found in the cecum. The polyp was sessile. Area was successfully injected with 3 mL Eleview for lesion assessment, and this injection appeared to lift the lesion adequately. The polyp was removed with a hot snare. Resection and retrieval were complete. The pathology specimen was placed into Bottle Number 2. A 6 mm polyp was found in the splenic flexure. The polyp was removed with a cold snare. Resection and retrieval were complete. The pathology specimen was placed into Bottle Number 3 Six polyps were found in the rectum, recto-sigmoid colon and sigmoid colon. The polyps were 4 to 6 mm in size. These polyps were removed with a hot snare. Resection and retrieval were complete. The pathology specimen was placed into Bottle Number 3. The exam was otherwise normal throughout the examined colon. External hemorrhoids were found during retroflexion. The hemorrhoids were small. Anal papilla(e) were hypertrophied.   Path: A. STOMACH, BIOPSY:  - Gastric oxyntic mucosa showing marked nonspecific reactive gastropathy  with erosion  - Warthin Starry stain is negative for Helicobacter pylori   B. COLON, CECUM, POLYPECTOMY:  - Sessile serrated polyp without cytologic dysplasia   C. COLON, SPLENIC FLEXURE, SIGMOID POLYPECTOMY:  - Tubular adenoma(s) without high-grade dysplasia or malignancy  - Hyperplastic polyp(s   Recommended repeat colonoscopy in 3 years  Past Medical History: Past Medical  History:  Diagnosis Date   Asthma    Asthma    GERD (gastroesophageal reflux disease)    PONV (postoperative nausea and vomiting)     Past  Surgical History: Past Surgical History:  Procedure Laterality Date   BIOPSY  08/02/2019   Procedure: BIOPSY;  Surgeon: Rogene Houston, MD;  [facility];  Service: Endoscopy;;  gastric biopsy   COLONOSCOPY WITH PROPOFOL N/A 08/02/2019   rehman:One 10 mm polyp in the cecum-sessile serrated polyp, one 6 mm polyp at splenic flexure, tubular adenoma, six 4-67mm polyps in rectum, at the recto sigmoid colon and in sigmoid colon, tubular adenoma. external hemorrhoids, anal papillae were hypertrophied   ESOPHAGEAL DILATION N/A 08/02/2019   Procedure: ESOPHAGEAL DILATION;  Surgeon: Rogene Houston, MD;  [facility];  Service: Endoscopy;  Laterality: N/A;   ESOPHAGOGASTRODUODENOSCOPY N/A 11/22/2013   Procedure: ESOPHAGOGASTRODUODENOSCOPY (EGD);  Surgeon: Rogene Houston, MD;  [facility];  Service: Endoscopy;  Laterality: N/A;  1155   ESOPHAGOGASTRODUODENOSCOPY (EGD) WITH PROPOFOL N/A 08/02/2019   Rehman: normal hypopharynx, normal esophagus, bening-appearing esophageal stenosis at GEJ, dilated, gastritis (no h pylori), normal duodenal bulb and second portion of duodenum   MALONEY DILATION N/A 11/22/2013   Procedure: MALONEY DILATION;  Surgeon: Rogene Houston, MD;  [facility];  Service: Endoscopy;  Laterality: N/A;   POLYPECTOMY  08/02/2019   Procedure: POLYPECTOMY;  Surgeon: Rogene Houston, MD;  [facility];  Service: Endoscopy;;  cecal polyp, splenic flexure polyp, sigmoid polyp x2, sigmoid rectal polyp x2, rectal polyp   pvc's     SINUS SURGERY WITH INSTATRAK      Family History:History reviewed. No pertinent family history.  Social History: Social History   Tobacco Use  Smoking Status Never  Smokeless Tobacco Never   Social History   Substance and Sexual Activity  Alcohol Use No   Social History   Substance and Sexual Activity  Drug Use No    Allergies: Allergies  Allergen Reactions   Penicillins Shortness Of  Breath    REACTION: Higher dose resulted in breathing difficulty   Bee Venom Swelling   Codeine Nausea And Vomiting, Nausea Only and Other (See Comments)    REACTION: dizziness    Medications: Current Outpatient Medications  Medication Sig Dispense Refill   albuterol (PROVENTIL) (2.5 MG/3ML) 0.083% nebulizer solution Take 3 mLs (2.5 mg total) by nebulization every 6 (six) hours as needed for wheezing or shortness of breath. 75 mL 12   Ascorbic Acid (VITAMIN C WITH ROSE HIPS) 500 MG tablet Take 500 mg by mouth daily.     cholecalciferol (VITAMIN D) 25 MCG (1000 UNIT) tablet Take 1,000 Units by mouth daily.     esomeprazole (NEXIUM) 20 MG packet Take 20 mg by mouth daily before breakfast. OTC once per day.     loratadine (CLARITIN) 10 MG tablet Take 10 mg by mouth daily.     thiamine (VITAMIN B1) 100 MG tablet Take 100 mg by mouth daily.     vitamin k 100 MCG tablet Take 100 mcg by mouth daily.     fluticasone (FLONASE) 50 MCG/ACT nasal spray Place 1 spray into both nostrils 2 (two) times daily. 16 g 2   No current facility-administered medications for this visit.    Review of Systems: GENERAL: negative for malaise, night sweats HEENT: No changes in hearing or vision, no nose bleeds or other nasal problems. NECK: Negative for lumps, goiter, pain and significant  neck swelling RESPIRATORY: Negative for cough, wheezing CARDIOVASCULAR: Negative for chest pain, leg swelling, palpitations, orthopnea GI: SEE HPI MUSCULOSKELETAL: Negative for joint pain or swelling, back pain, and muscle pain. SKIN: Negative for lesions, rash PSYCH: Negative for sleep disturbance, mood disorder and recent psychosocial stressors. HEMATOLOGY Negative for prolonged bleeding, bruising easily, and swollen nodes. ENDOCRINE: Negative for cold or heat intolerance, polyuria, polydipsia and goiter. NEURO: negative for tremor, gait imbalance, syncope and seizures. The remainder of the review of systems is  noncontributory.   Physical Exam: BP 133/82 (BP Location: Left Arm, Patient Position: Sitting, Cuff Size: Large)   Pulse 73   Temp 97.8 F (36.6 C) (Temporal)   Ht 5\' 6"  (1.676 m)   Wt 201 lb 3.2 oz (91.3 kg)   BMI 32.47 kg/m  GENERAL: The patient is AO x3, in no acute distress. HEENT: Head is normocephalic and atraumatic. EOMI are intact. Mouth is well hydrated and without lesions. NECK: Supple. No masses LUNGS: Clear to auscultation. No presence of rhonchi/wheezing/rales. Adequate chest expansion HEART: RRR, normal s1 and s2. ABDOMEN: Soft, nontender, no guarding, no peritoneal signs, and nondistended. BS +. No masses. EXTREMITIES: Without any cyanosis, clubbing, rash, lesions or edema. NEUROLOGIC: AOx3, no focal motor deficit. SKIN: no jaundice, no rashes  Imaging/Labs: as above  I personally reviewed and interpreted the available labs, imaging and endoscopic files.  Impression and Plan: Seth Hartman is a 57 y.o. male with PMH asthma and GERD, who presents for follow up of GERD.  The patient has presented adequate control of symptoms with low-dose Nexium 20 mg every day.  She has presented some occasional dysphagia but he is not very concerned about this and has not presented any other red flag signs.  For now we will continue to monitor his symptoms and he should continue with his current dose of Nexium.  Will proceed with a repeat colonoscopy in July 2024 as he is due for colorectal cancer screening with colonoscopy due to history of colon polyps.  - Continue Nexium 20 mg qday - Proceed with repeat colonoscopy in early July 2024  All questions were answered.      Seth Peppers, MD Gastroenterology and Hepatology [facility]

## 2022-07-12 ENCOUNTER — Encounter (INDEPENDENT_AMBULATORY_CARE_PROVIDER_SITE_OTHER): Payer: Self-pay | Admitting: *Deleted

## 2023-01-02 DIAGNOSIS — H3561 Retinal hemorrhage, right eye: Secondary | ICD-10-CM | POA: Diagnosis not present

## 2023-01-02 DIAGNOSIS — D3132 Benign neoplasm of left choroid: Secondary | ICD-10-CM | POA: Diagnosis not present

## 2023-01-20 ENCOUNTER — Encounter: Payer: Self-pay | Admitting: Emergency Medicine

## 2023-01-20 ENCOUNTER — Ambulatory Visit
Admission: EM | Admit: 2023-01-20 | Discharge: 2023-01-20 | Disposition: A | Discharge status: Home / Self Care | Payer: 59 | Attending: Family Medicine | Admitting: Family Medicine

## 2023-01-20 DIAGNOSIS — J309 Allergic rhinitis, unspecified: Secondary | ICD-10-CM | POA: Diagnosis not present

## 2023-01-20 DIAGNOSIS — J209 Acute bronchitis, unspecified: Secondary | ICD-10-CM

## 2023-01-20 MED ORDER — PROMETHAZINE-DM 6.25-15 MG/5ML PO SYRP: 5.0000 mL | ORAL_SOLUTION | Freq: Four times a day (QID) | ORAL | 0 refills | Status: AC | PRN

## 2023-01-20 MED ORDER — FLUTICASONE PROPIONATE 50 MCG/ACT NA SUSP: 1.0000 | Freq: Two times a day (BID) | NASAL | 2 refills | Status: AC

## 2023-01-20 MED ORDER — CETIRIZINE HCL 10 MG PO TABS: 10.0000 mg | ORAL_TABLET | Freq: Every day | ORAL | 2 refills | Status: AC

## 2023-01-20 MED ORDER — PREDNISONE 20 MG PO TABS: 40.0000 mg | ORAL_TABLET | Freq: Every day | ORAL | 0 refills | Status: AC

## 2023-01-20 NOTE — ED Provider Notes (Signed)
[facility]    CSN: [id number] Arrival date & time: 01/20/23  1404      History   Chief Complaint No chief complaint on file.   HPI NA Seth Hartman is a 57 y.o. male.   Presenting today with 1 week history of cough, nasal congestion, chest tightness.  Denies fever, chills, chest pain, shortness of breath, abdominal pain, nausea vomiting or diarrhea.  So far trying nasal saline and OTC nasal sprays with minimal relief.  History of seasonal allergies and asthma not currently on any medications for these.  No known sick contacts recently.    Past Medical History:  Diagnosis Date   Asthma    Asthma    GERD (gastroesophageal reflux disease)    PONV (postoperative nausea and vomiting)     Patient Active Problem List   Diagnosis Date Noted   Fatty liver 03/25/2021   History of esophageal stricture 03/25/2021   GERD (gastroesophageal reflux disease) 06/24/2019   Asthma, chronic 11/15/2013    Past Surgical History:  Procedure Laterality Date   BIOPSY  08/02/2019   Procedure: BIOPSY;  Surgeon: Malissa Hippo, MD;  [facility];  Service: Endoscopy;;  gastric biopsy   COLONOSCOPY WITH PROPOFOL N/A 08/02/2019   rehman:One 10 mm polyp in the cecum-sessile serrated polyp, one 6 mm polyp at splenic flexure, tubular adenoma, six 4-14mm polyps in rectum, at the recto sigmoid colon and in sigmoid colon, tubular adenoma. external hemorrhoids, anal papillae were hypertrophied   ESOPHAGEAL DILATION N/A 08/02/2019   Procedure: ESOPHAGEAL DILATION;  Surgeon: Malissa Hippo, MD;  [facility];  Service: Endoscopy;  Laterality: N/A;   ESOPHAGOGASTRODUODENOSCOPY N/A 11/22/2013   Procedure: ESOPHAGOGASTRODUODENOSCOPY (EGD);  Surgeon: Malissa Hippo, MD;  [facility];  Service: Endoscopy;  Laterality: N/A;  1155   ESOPHAGOGASTRODUODENOSCOPY (EGD) WITH PROPOFOL N/A 08/02/2019   Rehman: normal hypopharynx, normal esophagus, bening-appearing  esophageal stenosis at GEJ, dilated, gastritis (no h pylori), normal duodenal bulb and second portion of duodenum   MALONEY DILATION N/A 11/22/2013   Procedure: MALONEY DILATION;  Surgeon: Malissa Hippo, MD;  [facility];  Service: Endoscopy;  Laterality: N/A;   POLYPECTOMY  08/02/2019   Procedure: POLYPECTOMY;  Surgeon: Malissa Hippo, MD;  [facility];  Service: Endoscopy;;  cecal polyp, splenic flexure polyp, sigmoid polyp x2, sigmoid rectal polyp x2, rectal polyp   pvc's     SINUS SURGERY WITH INSTATRAK         Home Medications    Prior to Admission medications   Medication Sig Start Date End Date Taking? Authorizing Provider  cetirizine (ZYRTEC ALLERGY) 10 MG tablet Take 1 tablet (10 mg total) by mouth daily. 01/20/23  Yes Particia Nearing, PA-C  fluticasone [facility]) 50 MCG/ACT nasal spray Place 1 spray into both nostrils 2 (two) times daily. 01/20/23  Yes Particia Nearing, PA-C  predniSONE (DELTASONE) 20 MG tablet Take 2 tablets (40 mg total) by mouth daily with breakfast. 01/20/23  Yes Particia Nearing, PA-C  promethazine-dextromethorphan (PROMETHAZINE-DM) 6.25-15 MG/5ML syrup Take 5 mLs by mouth 4 (four) times daily as needed. 01/20/23  Yes Particia Nearing, PA-C  Ascorbic Acid (VITAMIN C WITH ROSE HIPS) 500 MG tablet Take 500 mg by mouth daily.    [provider]  cholecalciferol (VITAMIN D) 25 MCG (1000 UNIT) tablet Take 1,000 Units by mouth daily.    [provider]  esomeprazole (NEXIUM) 20 MG packet Take 20 mg  by mouth daily before breakfast. OTC once per day.    [provider]  thiamine (VITAMIN B1) 100 MG tablet Take 100 mg by mouth daily.    [provider]    Family History History reviewed. No pertinent family history.  Social History Social History   Tobacco Use   Smoking status: Never   Smokeless tobacco: Never  Vaping Use   Vaping status: Never Used  Substance Use Topics    Alcohol use: No   Drug use: No     Allergies   Penicillins, Bee venom, and Codeine   Review of Systems Review of Systems Per HPI  Physical Exam Triage Vital Signs ED Triage Vitals  Encounter Vitals Group     BP 01/20/23 1409 (!) 144/84     Systolic BP Percentile --      Diastolic BP Percentile --      Pulse Rate 01/20/23 1409 87     Resp 01/20/23 1409 18     Temp 01/20/23 1409 97.7 F (36.5 C)     Temp Source 01/20/23 1409 Oral     SpO2 01/20/23 1409 95 %     Weight --      Height --      Head Circumference --      Peak Flow --      Pain Score 01/20/23 1411 0     Pain Loc --      Pain Education --      Exclude from Growth Chart --    No data found.  Updated Vital Signs BP (!) 144/84 (BP Location: Right Arm)   Pulse 87   Temp 97.7 F (36.5 C) (Oral)   Resp 18   SpO2 95%   Visual Acuity Right Eye Distance:   Left Eye Distance:   Bilateral Distance:    Right Eye Near:   Left Eye Near:    Bilateral Near:     Physical Exam Vitals and nursing note reviewed.  Constitutional:      Appearance: He is well-developed.  HENT:     Head: Atraumatic.     Right Ear: External ear normal.     Left Ear: External ear normal.     Nose: Rhinorrhea present.     Mouth/Throat:     Pharynx: Posterior oropharyngeal erythema present. No oropharyngeal exudate.  Eyes:     Conjunctiva/sclera: Conjunctivae normal.     Pupils: Pupils are equal, round, and reactive to light.  Cardiovascular:     Rate and Rhythm: Normal rate and regular rhythm.  Pulmonary:     Effort: Pulmonary effort is normal. No respiratory distress.     Breath sounds: No wheezing or rales.  Musculoskeletal:        General: Normal range of motion.     Cervical back: Normal range of motion and neck supple.  Lymphadenopathy:     Cervical: No cervical adenopathy.  Skin:    General: Skin is warm and dry.  Neurological:     Mental Status: He is alert and oriented to person, place, and time.   Psychiatric:        Behavior: Behavior normal.      UC Treatments / Results  Labs (all labs ordered are listed, but only abnormal results are displayed) Labs Reviewed - No data to display  EKG   Radiology No results found.  Procedures Procedures (including critical care time)  Medications Ordered in UC Medications - No data to display  Initial Impression / Assessment  and Plan / UC Course  I have reviewed the triage vital signs and the nursing notes.  Pertinent labs & imaging results that were available during my care of the patient were reviewed by me and considered in my medical decision making (see chart for details).     Suspect bronchitis and uncontrolled seasonal allergies causing symptoms.  Treat with Zyrtec, Flonase, prednisone, Phenergan DM and supportive over-the-counter medications and home care.  Return for worsening symptoms.  Final Clinical Impressions(s) / UC Diagnoses   Final diagnoses:  Acute bronchitis, unspecified organism  Allergic sinusitis   Discharge Instructions   None    ED Prescriptions     Medication Sig Dispense Auth. Provider   fluticasone (FLONASE) 50 MCG/ACT nasal spray Place 1 spray into both nostrils 2 (two) times daily. 16 g Particia Nearing, [place]   cetirizine (ZYRTEC ALLERGY) 10 MG tablet Take 1 tablet (10 mg total) by mouth daily. 30 tablet Particia Nearing, [place]   predniSONE (DELTASONE) 20 MG tablet Take 2 tablets (40 mg total) by mouth daily with breakfast. 10 tablet Particia Nearing, PA-C   promethazine-dextromethorphan (PROMETHAZINE-DM) 6.25-15 MG/5ML syrup Take 5 mLs by mouth 4 (four) times daily as needed. 100 mL Particia Nearing, [place]      PDMP not reviewed this encounter.   Particia Nearing, [place] 01/20/23 [phone]

## 2023-01-20 NOTE — ED Triage Notes (Signed)
Cough and nasal congestion x 1 week.  

## 2023-02-25 LAB — LAB REPORT - SCANNED
A1c: 7.2
EGFR: 102
TSH: 1.9 (ref 0.41–5.90)

## 2023-06-12 ENCOUNTER — Ambulatory Visit: Payer: Self-pay | Admitting: Nurse Practitioner

## 2023-06-12 ENCOUNTER — Encounter: Attending: Internal Medicine | Admitting: Nutrition

## 2023-06-12 VITALS — Ht 66.0 in | Wt 205.0 lb

## 2023-06-12 DIAGNOSIS — E118 Type 2 diabetes mellitus with unspecified complications: Secondary | ICD-10-CM

## 2023-06-12 DIAGNOSIS — E119 Type 2 diabetes mellitus without complications: Secondary | ICD-10-CM | POA: Diagnosis present

## 2023-06-12 DIAGNOSIS — E782 Mixed hyperlipidemia: Secondary | ICD-10-CM

## 2023-06-12 DIAGNOSIS — E669 Obesity, unspecified: Secondary | ICD-10-CM

## 2023-06-12 DIAGNOSIS — Z713 Dietary counseling and surveillance: Secondary | ICD-10-CM | POA: Diagnosis not present

## 2023-06-12 NOTE — Patient Instructions (Signed)
  Goals Established by Pt Cut out sugar free gatorade and other sf products Increase foods of fruits, vegetables and whole grains from the garden Cut out snacks Walk 30 minutes a day Test blood sugars before breakfast and bedtime Get A1c Down to 5.7%. Lose 10 lbs.

## 2023-06-12 NOTE — Progress Notes (Signed)
 Medical Nutrition Therapy  Appointment Start time:  [phone]  Appointment End time:  1000  Primary concerns today: DM Type 2  Referral diagnosis: e11.8 Preferred learning style: no preference  Learning readiness: Ready   NUTRITION ASSESSMENT  58 yr old wmale referred from Dr. Lewayne Records for Type 2 DM. A1C 7.2%. He is a paid Investment banker, corporate. Is on Glimiperide. Feels like his A1C went up when he took steriods for a URI. FBS 120-130's.  He is willing to make some changes with his diet and exercise to work on reversing his DM.  Clinical Medical Hx:  Past Medical History:  Diagnosis Date   Asthma    Asthma    GERD (gastroesophageal reflux disease)    PONV (postoperative nausea and vomiting)     Medications:  Current Outpatient Medications on File Prior to Visit  Medication Sig Dispense Refill   cetirizine  (ZYRTEC  ALLERGY) 10 MG tablet Take 1 tablet (10 mg total) by mouth daily. 30 tablet 2   cholecalciferol (VITAMIN D) 25 MCG (1000 UNIT) tablet Take 1,000 Units by mouth daily.     esomeprazole  (NEXIUM ) 20 MG packet Take 20 mg by mouth daily before breakfast. OTC once per day.     glimepiride (AMARYL) 4 MG tablet Take 4 mg by mouth daily with breakfast.     ibuprofen (ADVIL) 200 MG tablet Take 200 mg by mouth every 6 (six) hours as needed.     Ascorbic Acid (VITAMIN C WITH ROSE HIPS) 500 MG tablet Take 500 mg by mouth daily.     fluticasone  (FLONASE ) 50 MCG/ACT nasal spray Place 1 spray into both nostrils 2 (two) times daily. 16 g 2   predniSONE  (DELTASONE ) 20 MG tablet Take 2 tablets (40 mg total) by mouth daily with breakfast. (Patient not taking: Reported on 06/12/2023) 10 tablet 0   promethazine -dextromethorphan (PROMETHAZINE -DM) 6.25-15 MG/5ML syrup Take 5 mLs by mouth 4 (four) times daily as needed. 100 mL 0   thiamine (VITAMIN B1) 100 MG tablet Take 100 mg by mouth daily. (Patient not taking: Reported on 06/12/2023)     No current facility-administered medications on file prior to visit.     Labs: A1C 7.2%. TCHOL 217 mg/dl, LDL 99 mg/dl HDL 37 mg/dl and TG 213 mg/dl. Notable Signs/Symptoms: Some low blood sugar symptoms   Lifestyle & Dietary Hx Single, works Radiation protection practitioner. Eats mostly at home  Estimated daily fluid intake: SF gatorade- 100 oz Supplements: Vit D Sleep: 8 hrs Stress / self-care: None Current average weekly physical activity: Mows grass, Walks at work 20 minutes a day  24-Hr Dietary Recall First Meal: Boost Control with adkins bars Snack: cheese slices Second Meal: Salad from [employer]-turkey, ham and bacon, SF Gatorades Snack: sf cookies Third Meal: Pete's burgers, water  Snack: sf cookies Beverages: SF Gatorades.  Estimated Energy Needs Calories: 1800 Carbohydrate: 200g Protein: 135g Fat: 50g   NUTRITION DIAGNOSIS  NB-1.1 Food and nutrition-related knowledge deficit As related to Diabetes Type 2!.  As evidenced by A1c 7.2%.   NUTRITION INTERVENTION  Nutrition education (E-1) on the following topics:  Nutrition and Diabetes education provided on My Plate, CHO counting, meal planning, portion sizes, timing of meals, avoiding snacks between meals unless having a low blood sugar, target ranges for A1C and blood sugars, signs/symptoms and treatment of hyper/hypoglycemia, monitoring blood sugars, taking medications as prescribed, benefits of exercising 30 minutes per day and prevention of complications of DM. Lifestyle Medicine  - Whole Food, Plant Predominant Nutrition is highly recommended: Eat  Plenty of vegetables, Mushrooms, fruits, Legumes, Whole Grains, Nuts, seeds in lieu of processed meats, processed snacks/pastries red meat, poultry, eggs.    -It is better to avoid simple carbohydrates including: Cakes, Sweet Desserts, Ice Cream, Soda (diet and regular), Sweet Tea, Candies, Chips, Cookies, Store Bought Juices, Alcohol in Excess of  1-2 drinks a day, Lemonade,  Artificial Sweeteners, Doughnuts, Coffee Creamers, "Sugar-free" Products, etc, etc.   This is not a complete list.....  Exercise: If you are able: 30 -60 minutes a day ,4 days a week, or 150 minutes a week.  The longer the better.  Combine stretch, strength, and aerobic activities.  If you were told in the past that you have high risk for cardiovascular diseases, you may seek evaluation by your heart doctor prior to initiating moderate to intense exercise programs.   Handouts Provided Include  Know your numbers Diabetes and You Lifestyle Medicine S/s/ of Hyper/hypoglycemia  Learning Style & Readiness for Change Teaching method utilized: Visual & Auditory  Demonstrated degree of understanding via: Teach Back  Barriers to learning/adherence to lifestyle change: none  Goals Established by Pt Cut out sugar free gatorade and other sf products Increase foods of fruits, vegetables and whole grains from the garden Cut out snacks Walk 30 minutes a day Test blood sugars before breakfast and bedtime Get A1c Down to 5.7%. Lose 10 lbs.   MONITORING & EVALUATION Dietary intake, weekly physical activity, and blood sugars in 1 month.  Next Steps  Patient is to work on meal planning and cutting out processed and sugar free products.Aaron Aas

## 2023-07-17 ENCOUNTER — Encounter: Payer: Self-pay | Admitting: Nutrition

## 2023-07-17 ENCOUNTER — Encounter: Attending: Internal Medicine | Admitting: Nutrition

## 2023-07-17 DIAGNOSIS — E119 Type 2 diabetes mellitus without complications: Secondary | ICD-10-CM | POA: Insufficient documentation

## 2023-07-17 DIAGNOSIS — Z713 Dietary counseling and surveillance: Secondary | ICD-10-CM | POA: Insufficient documentation

## 2023-07-17 NOTE — Progress Notes (Signed)
 Medical Nutrition Therapy  Appointment Start time:  [phone] Appointment End time:  1005 Primary concerns today: DM Type 2  Referral diagnosis: e11.8 Preferred learning style: no preference  Learning readiness: Ready   NUTRITION ASSESSMENT  58 yr old wmale referred from Dr. Lewayne Records for Type 2 DM. Is going to have to see a new PCP due to his retirement. "I stopped my Glimiperide myself because my blood sugars were getting better and sometimes dropping too low." Meter shows he is testing twice a day; FBS 90-120's and Betdime 95 -120's. Making excellent progress. Feels better. Digestion better. No longer drinking Gatorades, or diet sodas. Cut out a lot of processed foods of nabs, chips, ice cream and junk food. Eating more fruits and vegetables. Walking some.  Goals set from last visit. Cut out sugar free gatorade and other sf products-done Increase foods of fruits, vegetables and whole grains from the garden-done Cut out snacks- done Walk 30 minutes a day- improved Test blood sugars before breakfast and bedtime-done Get A1c Down to 5.7%.- hasn't gotten new A1C yet. Lose 10 lbs.-Lost 11 lbs   Clinical Medical Hx:  Past Medical History:  Diagnosis Date   Asthma    Asthma    GERD (gastroesophageal reflux disease)    PONV (postoperative nausea and vomiting)     Medications:  Current Outpatient Medications on File Prior to Visit  Medication Sig Dispense Refill   Ascorbic Acid (VITAMIN C WITH ROSE HIPS) 500 MG tablet Take 500 mg by mouth daily.     cetirizine  (ZYRTEC  ALLERGY) 10 MG tablet Take 1 tablet (10 mg total) by mouth daily. 30 tablet 2   cholecalciferol (VITAMIN D) 25 MCG (1000 UNIT) tablet Take 1,000 Units by mouth daily.     esomeprazole  (NEXIUM ) 20 MG packet Take 20 mg by mouth daily before breakfast. OTC once per day.     fluticasone  (FLONASE ) 50 MCG/ACT nasal spray Place 1 spray into both nostrils 2 (two) times daily. 16 g 2   glimepiride (AMARYL) 4 MG tablet Take 4 mg by  mouth daily with breakfast.     ibuprofen (ADVIL) 200 MG tablet Take 200 mg by mouth every 6 (six) hours as needed.     predniSONE  (DELTASONE ) 20 MG tablet Take 2 tablets (40 mg total) by mouth daily with breakfast. (Patient not taking: Reported on 06/12/2023) 10 tablet 0   promethazine -dextromethorphan (PROMETHAZINE -DM) 6.25-15 MG/5ML syrup Take 5 mLs by mouth 4 (four) times daily as needed. 100 mL 0   thiamine (VITAMIN B1) 100 MG tablet Take 100 mg by mouth daily. (Patient not taking: Reported on 06/12/2023)     No current facility-administered medications on file prior to visit.    Labs: A1C 7.2%. TCHOL 217 mg/dl, LDL 99 mg/dl HDL 37 mg/dl and TG 696 mg/dl. Notable Signs/Symptoms: Some low blood sugar symptoms   Lifestyle & Dietary Hx Single, works Radiation protection practitioner. Eats mostly at home  Estimated daily fluid intake: SF gatorade- 100 oz Supplements: Vit D Sleep: 8 hrs Stress / self-care: None Current average weekly physical activity: Mows grass, Walks at work 20 minutes a day  24-Hr Dietary Recall First Meal: Boost Control with adkins bars Snack: cheese slices Second Meal: Salad from [employer]-turkey, ham and bacon, SF Gatorades Snack: sf cookies Third Meal: [employer] burgers, water  Snack: sf cookies Beverages: SF Gatorades.  Estimated Energy Needs Calories: 1800 Carbohydrate: 200g Protein: 135g Fat: 50g   NUTRITION DIAGNOSIS  NB-1.1 Food and nutrition-related knowledge deficit As related to Diabetes Type  2!.  As evidenced by A1c 7.2%.   NUTRITION INTERVENTION  Nutrition education (E-1) on the following topics:  Nutrition and Diabetes education provided on My Plate, CHO counting, meal planning, portion sizes, timing of meals, avoiding snacks between meals unless having a low blood sugar, target ranges for A1C and blood sugars, signs/symptoms and treatment of hyper/hypoglycemia, monitoring blood sugars, taking medications as prescribed, benefits of exercising 30 minutes per day and  prevention of complications of DM. Lifestyle Medicine  - Whole Food, Plant Predominant Nutrition is highly recommended: Eat Plenty of vegetables, Mushrooms, fruits, Legumes, Whole Grains, Nuts, seeds in lieu of processed meats, processed snacks/pastries red meat, poultry, eggs.    -It is better to avoid simple carbohydrates including: Cakes, Sweet Desserts, Ice Cream, Soda (diet and regular), Sweet Tea, Candies, Chips, Cookies, Store Bought Juices, Alcohol in Excess of  1-2 drinks a day, Lemonade,  Artificial Sweeteners, Doughnuts, Coffee Creamers, "Sugar-free" Products, etc, etc.  This is not a complete list.....  Exercise: If you are able: 30 -60 minutes a day ,4 days a week, or 150 minutes a week.  The longer the better.  Combine stretch, strength, and aerobic activities.  If you were told in the past that you have high risk for cardiovascular diseases, you may seek evaluation by your heart doctor prior to initiating moderate to intense exercise programs.   Handouts Provided Include  Know your numbers Diabetes and You Lifestyle Medicine S/s/ of Hyper/hypoglycemia  Learning Style & Readiness for Change Teaching method utilized: Visual & Auditory  Demonstrated degree of understanding via: Teach Back  Barriers to learning/adherence to lifestyle change: none  Goals Established by Pt Cut out sugar free gatorade and other sf products-done Increase foods of fruits, vegetables and whole grains from the garden-done Cut out snacks- done Walk 30 minutes a day- improved Test blood sugars before breakfast and bedtime-done Get A1c Down to 5.7%.- hasn't gotten new A1C yet. Lose 10 lbs.-Lost 11 lbs.   MONITORING & EVALUATION Dietary intake, weekly physical activity, and blood sugars in 4 month.  Next Steps  Patient is to work on meal planning and cutting out processed and sugar free products.Aaron Aas

## 2023-07-17 NOTE — Patient Instructions (Addendum)
 Goals Keep up the great job! Lose 10 lbs over the next 3-6 months Aim to get Fiber to 38 grams per day Get A1C to 5.7 % or below Scheduled appt with local PCP

## 2023-11-20 ENCOUNTER — Encounter: Attending: Internal Medicine | Admitting: Nutrition

## 2023-11-20 ENCOUNTER — Encounter: Payer: Self-pay | Admitting: Nutrition

## 2023-11-20 VITALS — Wt 196.0 lb

## 2023-11-20 DIAGNOSIS — E118 Type 2 diabetes mellitus with unspecified complications: Secondary | ICD-10-CM | POA: Diagnosis present

## 2023-11-20 NOTE — Patient Instructions (Addendum)
 Goals Increase fruit to 1 serving per meal Walk 30 minutes a day most days of week Better balanced meals throughout the day Cut down on processed foods. Work on mindful eating Lose 5 lbs by next visit.

## 2023-11-20 NOTE — Progress Notes (Signed)
 Medical Nutrition Therapy  Appointment Start time:  0800 Appointment End time:  0830 Primary concerns today: DM Type 2  Referral diagnosis: e11.8 Preferred learning style: no preference  Learning readiness: Ready   NUTRITION ASSESSMENT  Follow up DM Weight up 2 lbs. FBS 123 mg/dl [id number]  Bedtime: 119 mg/dl. No recent A1C. Needs to find a PCP. Has cut done on gatorades and drinkig more water  Mowing grass for activity.. Hasn't gotten a primary provider yet [place] Willing to get back on track with eating healthier foods and cutting out processed foods.   Clinical Wt Readings from Last 3 Encounters:  11/20/23 196 lb (88.9 kg)  07/17/23 194 lb 9.6 oz (88.3 kg)  06/12/23 205 lb (93 kg)   Ht Readings from Last 3 Encounters:  07/17/23 5' 6 (1.676 m)  06/12/23 5' 6 (1.676 m)  05/09/22 5' 6 (1.676 m)   Body mass index is 31.64 kg/m. @BMIFA @ Facility age limit for growth %iles is 20 years. Facility age limit for growth %iles is 20 years.  Medical Hx:  Past Medical History:  Diagnosis Date   Asthma    Asthma    GERD (gastroesophageal reflux disease)    PONV (postoperative nausea and vomiting)     Medications:  Current Outpatient Medications on File Prior to Visit  Medication Sig Dispense Refill   Ascorbic Acid (VITAMIN C WITH ROSE HIPS) 500 MG tablet Take 500 mg by mouth daily.     cetirizine  (ZYRTEC  ALLERGY) 10 MG tablet Take 1 tablet (10 mg total) by mouth daily. 30 tablet 2   cholecalciferol (VITAMIN D) 25 MCG (1000 UNIT) tablet Take 1,000 Units by mouth daily.     esomeprazole  (NEXIUM ) 20 MG packet Take 20 mg by mouth daily before breakfast. OTC once per day.     fluticasone  (FLONASE ) 50 MCG/ACT nasal spray Place 1 spray into both nostrils 2 (two) times daily. 16 g 2   glimepiride (AMARYL) 4 MG tablet Take 4 mg by mouth daily with breakfast.     ibuprofen (ADVIL) 200 MG tablet Take 200 mg  by mouth every 6 (six) hours as needed.     predniSONE  (DELTASONE ) 20 MG tablet Take 2 tablets (40 mg total) by mouth daily with breakfast. (Patient not taking: Reported on 06/12/2023) 10 tablet 0   promethazine -dextromethorphan (PROMETHAZINE -DM) 6.25-15 MG/5ML syrup Take 5 mLs by mouth 4 (four) times daily as needed. 100 mL 0   thiamine (VITAMIN B1) 100 MG tablet Take 100 mg by mouth daily. (Patient not taking: Reported on 06/12/2023)     No current facility-administered medications on file prior to visit.    Labs: A1C 7.2%. TCHOL 217 mg/dl, LDL 99 mg/dl HDL 37 mg/dl and TG 520 mg/dl. 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 Third Meal: salads, some chicken, less beef overall , green beans, Beverages: SF Gatorades- reduced, increased water  intake   Estimated Energy Needs Calories: 1800 Carbohydrate: 200g Protein: 135g Fat: 50g   NUTRITION DIAGNOSIS  NB-1.1 Food and nutrition-related knowledge deficit As related to Diabetes Type 2!.  As  evidenced by A1c 7.2%.   NUTRITION INTERVENTION  Nutrition education (E-1) on the following topics:  Nutrition and Diabetes education provided on My Plate, CHO counting, meal planning, portion sizes, timing of meals, avoiding snacks between meals unless having a low blood sugar, target ranges for A1C and blood sugars, signs/symptoms and treatment of hyper/hypoglycemia, monitoring blood sugars, taking medications as prescribed, benefits of exercising 30 minutes per day and prevention of complications of DM. Lifestyle Medicine  - Whole Food, Plant Predominant Nutrition is highly  recommended: Eat Plenty of vegetables, Mushrooms, fruits, Legumes, Whole Grains, Nuts, seeds in lieu of processed meats, processed snacks/pastries red meat, poultry, eggs.    -It is better to avoid simple carbohydrates including: Cakes, Sweet Desserts, Ice Cream, Soda (diet and regular), Sweet Tea, Candies, Chips, Cookies, Store Bought Juices, Alcohol in Excess of  1-2 drinks a day, Lemonade,  Artificial Sweeteners, Doughnuts, Coffee Creamers, Sugar-free Products, etc, etc.  This is not a complete list.....  Exercise: If you are able: 30 -60 minutes a day ,4 days a week, or 150 minutes a week.  The longer the better.  Combine stretch, strength, and aerobic activities.  If you were told in the past that you have high risk for cardiovascular diseases, you may seek evaluation by your heart doctor prior to initiating moderate to intense exercise programs.   Handouts Provided Include  Know your numbers Diabetes and You Lifestyle Medicine S/s/ of Hyper/hypoglycemia  Learning Style & Readiness for Change Teaching method utilized: Visual & Auditory  Demonstrated degree of understanding via: Teach Back  Barriers to learning/adherence to lifestyle change: none  Goals Established by Pt Goals Increase fruit to 1 serving per meal Walk 30 minutes a day most days of week Better balanced meals throughout the day Cut down on processed foods. Work on mindful eating Lose 5 lbs by next visit.  MONITORING & EVALUATION Dietary intake, weekly physical activity, and blood sugars in 2 month.  Next Steps  Patient is to work on meal planning and cutting out processed and sugar free products.SABRA

## 2023-11-22 ENCOUNTER — Encounter (INDEPENDENT_AMBULATORY_CARE_PROVIDER_SITE_OTHER): Payer: Self-pay | Admitting: Gastroenterology

## 2024-01-15 ENCOUNTER — Encounter: Payer: Self-pay | Admitting: Nutrition

## 2024-01-15 ENCOUNTER — Encounter: Attending: Internal Medicine | Admitting: Nutrition

## 2024-01-15 VITALS — Ht 66.0 in | Wt 204.0 lb

## 2024-01-15 DIAGNOSIS — E118 Type 2 diabetes mellitus with unspecified complications: Secondary | ICD-10-CM | POA: Diagnosis present

## 2024-01-15 NOTE — Progress Notes (Signed)
 Medical Nutrition Therapy  Appointment Start time:  0800 Appointment End time:  0830 Primary concerns today: DM Type 2  Referral diagnosis: e11.8 Preferred learning style: no preference  Learning readiness: Ready   NUTRITION ASSESSMENT  Follow up DM Weight up 8 lbs. Admits he has fell back into some old habits and needs a reset. BS are 180-200's sometimes. He hasn't made an appointment with new PCP yet. Will make appointment with [facility] or [facility] today. Willing to get back on track with eating better, drinking water  and exercising. Had been on Glipizide but hasn't taken it in a long time. Needs to be reassessed by new PCP.   Clinical Wt Readings from Last 3 Encounters:  01/15/24 204 lb (92.5 kg)  11/20/23 196 lb (88.9 kg)  07/17/23 194 lb 9.6 oz (88.3 kg)   Ht Readings from Last 3 Encounters:  01/15/24 5' 6 (1.676 m)  07/17/23 5' 6 (1.676 m)  06/12/23 5' 6 (1.676 m)   Body mass index is 32.93 kg/m. @BMIFA @ Facility age limit for growth %iles is 20 years. Facility age limit for growth %iles is 20 years.   Medical Hx:  Past Medical History:  Diagnosis Date   Asthma    Asthma    GERD (gastroesophageal reflux disease)    PONV (postoperative nausea and vomiting)     Medications:  Current Outpatient Medications on File Prior to Visit  Medication Sig Dispense Refill   Ascorbic Acid (VITAMIN C WITH ROSE HIPS) 500 MG tablet Take 500 mg by mouth daily.     cetirizine  (ZYRTEC  ALLERGY) 10 MG tablet Take 1 tablet (10 mg total) by mouth daily. 30 tablet 2   cholecalciferol (VITAMIN D) 25 MCG (1000 UNIT) tablet Take 1,000 Units by mouth daily.     esomeprazole  (NEXIUM ) 20 MG packet Take 20 mg by mouth daily before breakfast. OTC once per day.     fluticasone  (FLONASE ) 50 MCG/ACT nasal spray Place 1 spray into both nostrils 2 (two) times daily. 16 g 2   glimepiride (AMARYL) 4 MG tablet Take 4 mg by mouth daily with breakfast.     ibuprofen  (ADVIL) 200 MG tablet Take 200 mg by mouth every 6 (six) hours as needed.     predniSONE  (DELTASONE ) 20 MG tablet Take 2 tablets (40 mg total) by mouth daily with breakfast. (Patient not taking: Reported on 06/12/2023) 10 tablet 0   promethazine -dextromethorphan (PROMETHAZINE -DM) 6.25-15 MG/5ML syrup Take 5 mLs by mouth 4 (four) times daily as needed. 100 mL 0   thiamine (VITAMIN B1) 100 MG tablet Take 100 mg by mouth daily. (Patient not taking: Reported on 06/12/2023)     No current facility-administered medications on file prior to visit.    Labs: A1C 7.2%. TCHOL 217 mg/dl, LDL 99 mg/dl HDL 37 mg/dl and TG 520 mg/dl. 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 Drinking gatorade. Eating more processed foods.  Estimated Energy Needs Calories: 1800 Carbohydrate: 200g Protein: 135g Fat: 50g   NUTRITION DIAGNOSIS  NB-1.1 Food and nutrition-related knowledge deficit As related to Diabetes Type 2!.  As evidenced by A1c 7.2%.   NUTRITION INTERVENTION  Nutrition education (E-1) on  the following topics:  Nutrition and Diabetes education provided on My Plate, CHO counting, meal planning, portion sizes, timing of meals, avoiding snacks between meals unless having a low blood sugar, target ranges for A1C and blood sugars, signs/symptoms and treatment of hyper/hypoglycemia, monitoring blood sugars, taking medications as prescribed, benefits of exercising 30 minutes per day and prevention of complications of DM. Lifestyle Medicine  - Whole Food, Plant Predominant Nutrition is highly recommended: Eat Plenty of vegetables, Mushrooms, fruits, Legumes, Whole Grains, Nuts,  seeds in lieu of processed meats, processed snacks/pastries red meat, poultry, eggs.    -It is better to avoid simple carbohydrates including: Cakes, Sweet Desserts, Ice Cream, Soda (diet and regular), Sweet Tea, Candies, Chips, Cookies, Store Bought Juices, Alcohol in Excess of  1-2 drinks a day, Lemonade,  Artificial Sweeteners, Doughnuts, Coffee Creamers, Sugar-free Products, etc, etc.  This is not a complete list.....  Exercise: If you are able: 30 -60 minutes a day ,4 days a week, or 150 minutes a week.  The longer the better.  Combine stretch, strength, and aerobic activities.  If you were told in the past that you have high risk for cardiovascular diseases, you may seek evaluation by your heart doctor prior to initiating moderate to intense exercise programs.   Handouts Provided Include  Know your numbers Diabetes and You Lifestyle Medicine S/s/ of Hyper/hypoglycemia  Learning Style & Readiness for Change Teaching method utilized: Visual & Auditory  Demonstrated degree of understanding via: Teach Back  Barriers to learning/adherence to lifestyle change: none  Goals Established by Pt Goals  Exercise by walking for 30 minutes 3 times per week. Get an appt with a PCP today. Work on eating 3 better balanced meals. Test blood sugars twice a day. Drink 4 bottles of water  per day; 64 oz a day.  MONITORING & EVALUATION Dietary intake, weekly physical activity, and blood sugars in 2 month.  Needs to be restarted on oral medications due to higher blood sugars.  Next Steps  Patient is to work on meal planning and cutting out processed and sugar free products.SABRA

## 2024-01-15 NOTE — Patient Instructions (Signed)
 Goals  Exercise by walking for 30 minutes 3 times per week. Get an appt with a PCP today. Work on eating 3 better balanced meals. Test blood sugars twice a day. Drink 4 bottles of water  per day; 64 oz a day.

## 2024-03-11 ENCOUNTER — Encounter: Admitting: Nutrition

## 2024-03-25 ENCOUNTER — Encounter: Admitting: Nutrition

## 2024-04-25 ENCOUNTER — Ambulatory Visit
